# Patient Record
Sex: Female | Born: 1985 | Race: White | Hispanic: Yes | Smoking: Never smoker
Health system: Southern US, Community
[De-identification: ages and names within clinical notes are randomized; demographics above are authoritative.]

## PROBLEM LIST (undated history)

## (undated) DIAGNOSIS — N83201 Unspecified ovarian cyst, right side: Secondary | ICD-10-CM

## (undated) DIAGNOSIS — N83202 Unspecified ovarian cyst, left side: Secondary | ICD-10-CM

## (undated) HISTORY — PX: NO PAST SURGERIES: SHX2092

## (undated) HISTORY — DX: Unspecified ovarian cyst, right side: N83.201

## (undated) HISTORY — DX: Unspecified ovarian cyst, right side: N83.202

---

## 2018-09-24 ENCOUNTER — Encounter (HOSPITAL_COMMUNITY): Payer: Self-pay | Admitting: Emergency Medicine

## 2018-09-24 ENCOUNTER — Ambulatory Visit (HOSPITAL_COMMUNITY)
Admission: EM | Admit: 2018-09-24 | Discharge: 2018-09-24 | Disposition: A | Discharge status: Home / Self Care | Payer: BC Managed Care – PPO | Attending: Family Medicine | Admitting: Family Medicine

## 2018-09-24 ENCOUNTER — Other Ambulatory Visit: Payer: Self-pay

## 2018-09-24 DIAGNOSIS — Z3202 Encounter for pregnancy test, result negative: Secondary | ICD-10-CM | POA: Diagnosis not present

## 2018-09-24 DIAGNOSIS — N644 Mastodynia: Secondary | ICD-10-CM

## 2018-09-24 DIAGNOSIS — N6452 Nipple discharge: Secondary | ICD-10-CM

## 2018-09-24 LAB — POCT PREGNANCY, URINE: Preg Test, Ur: NEGATIVE

## 2018-09-24 MED ORDER — NAPROXEN 500 MG PO TABS: 500.0000 mg | ORAL_TABLET | Freq: Two times a day (BID) | ORAL | 0 refills | Status: DC

## 2018-09-24 NOTE — ED Triage Notes (Signed)
Pt presents to [facility] for assessment of bilateral breast pain x 4 years, with return of pain yesterday and patient noted discharge from her nipples.

## 2018-09-24 NOTE — Discharge Instructions (Addendum)
Se le llamar para programar una cita para la mamografa. Naproxeno dos veces al da para Best boy, tmelo con las comidas. Consulte con un proveedor de atencin primaria para que vuelva a verificar si hay sntomas persistentes.   You will be called to set up mammogram appointment time. Naproxen twice a day to help with pain, take with food.  please establish with a primary care provider for recheck for any persistent symptoms.

## 2018-09-24 NOTE — ED Provider Notes (Signed)
[place]    CSN: [id number] Arrival date & time: 09/24/18  1111      History   Chief Complaint Chief Complaint  Patient presents with  . Breast Pain    HPI Julie Mckay is a 33 y.o. female.   Julie Mckay presents with complaints of bilateral breast pain which started yesterday, as well as spontaneous discharge which was dark yellow in color. Today pain is 4/10 in severity. Pain is constant. LMP 8/12, still with some active bleeding. She states she tends to have some irregular periods which are heavy and last 7-10 days. She has a nexplanon. She has two living children. States she has had the same in the past and has had a mammogram which did find benign cysts. No other breast history. No family history of breast cancer. She moved here recently therefore does not have a PCP or gynecologist she follows with. Hasn't taken any medications for her symptoms.     ROS per HPI, negative if not otherwise mentioned.      History reviewed. No pertinent past medical history.  There are no active problems to display for this patient.   History reviewed. No pertinent surgical history.  OB History   No obstetric history on file.      Home Medications    Prior to Admission medications   Medication Sig Start Date End Date Taking? Authorizing Provider  naproxen (NAPROSYN) 500 MG tablet Take 1 tablet (500 mg total) by mouth 2 (two) times daily. 09/24/18   Zigmund Gottron, NP    Family History History reviewed. No pertinent family history.  Social History Social History   Tobacco Use  . Smoking status: Never Smoker  . Smokeless tobacco: Never Used  Substance Use Topics  . Alcohol use: Yes    Comment: sometimes  . Drug use: Never     Allergies   Patient has no known allergies.   Review of Systems Review of Systems   Physical Exam Triage Vital Signs ED Triage Vitals  Enc Vitals Group     BP 09/24/18 1219 117/79     Pulse Rate  09/24/18 1219 79     Resp 09/24/18 1219 16     Temp 09/24/18 1219 98.5 F (36.9 C)     Temp Source 09/24/18 1219 Oral     SpO2 09/24/18 1219 100 %     Weight --      Height --      Head Circumference --      Peak Flow --      Pain Score 09/24/18 1238 5     Pain Loc --      Pain Edu? --      Excl. in [place]? --    No data found.  Updated Vital Signs BP 117/79 (BP Location: Left Arm)   Pulse 79   Temp 98.5 F (36.9 C) (Oral)   Resp 16   SpO2 100%   Physical Exam Constitutional:      General: She is not in acute distress.    Appearance: She is well-developed.  Cardiovascular:     Rate and Rhythm: Normal rate.  Pulmonary:     Effort: Pulmonary effort is normal.  Chest:     Breasts:        Right: Tenderness present. No swelling, mass or skin change.        Left: Tenderness present. No swelling, mass or skin change.     Comments: Bilateral breasts are  generally tender without any visualized discharge and no obvious palpable mass or deformity Skin:    General: Skin is warm and dry.  Neurological:     Mental Status: She is alert and oriented to person, place, and time.      UC Treatments / Results  Labs (all labs ordered are listed, but only abnormal results are displayed) Labs Reviewed  POC URINE PREG, ED  POCT PREGNANCY, URINE    EKG   Radiology No results found.  Procedures Procedures (including critical care time)  Medications Ordered in UC Medications - No data to display  Initial Impression / Assessment and Plan / UC Course  I have reviewed the triage vital signs and the nursing notes.  Pertinent labs & imaging results that were available during my care of the patient were reviewed by me and considered in my medical decision making (see chart for details).     nsaids recommended. Mammogram ordered for monitoring due to spontaneous discharge. Encouraged follow up/ establish with PCP for recheck of persistent symptoms. Patient verbalized understanding  and agreeable to plan.  Ambulatory out of clinic without difficulty.    Final Clinical Impressions(s) / UC Diagnoses   Final diagnoses:  Breast pain  Breast discharge     Discharge Instructions     Se le llamar para programar una cita para la mamografa. Naproxeno dos veces al da para Best boy, tmelo con las comidas. Consulte con un proveedor de atencin primaria para que vuelva a verificar si hay sntomas persistentes.   You will be called to set up mammogram appointment time. Naproxen twice a day to help with pain, take with food.  please establish with a primary care provider for recheck for any persistent symptoms.    ED Prescriptions    Medication Sig Dispense Auth. Provider   naproxen (NAPROSYN) 500 MG tablet Take 1 tablet (500 mg total) by mouth 2 (two) times daily. 30 tablet Zigmund Gottron, NP     Controlled Substance Prescriptions [place] Controlled Substance Registry consulted? Not Applicable   Zigmund Gottron, NP 09/24/18 1425

## 2018-09-24 NOTE — ED Notes (Signed)
Patient able to ambulate independently  

## 2018-09-28 ENCOUNTER — Other Ambulatory Visit: Payer: Self-pay | Admitting: Emergency Medicine

## 2018-09-28 DIAGNOSIS — N644 Mastodynia: Secondary | ICD-10-CM

## 2018-10-08 ENCOUNTER — Ambulatory Visit
Admission: RE | Admit: 2018-10-08 | Discharge: 2018-10-08 | Disposition: A | Discharge status: Home / Self Care | Payer: BC Managed Care – PPO | Source: Ambulatory Visit | Attending: Emergency Medicine | Admitting: Emergency Medicine

## 2018-10-08 ENCOUNTER — Other Ambulatory Visit: Payer: Self-pay

## 2018-10-08 DIAGNOSIS — N644 Mastodynia: Secondary | ICD-10-CM

## 2018-10-08 DIAGNOSIS — R922 Inconclusive mammogram: Secondary | ICD-10-CM | POA: Diagnosis not present

## 2018-10-08 DIAGNOSIS — N6001 Solitary cyst of right breast: Secondary | ICD-10-CM | POA: Diagnosis not present

## 2018-10-08 DIAGNOSIS — N6002 Solitary cyst of left breast: Secondary | ICD-10-CM | POA: Diagnosis not present

## 2018-10-08 DIAGNOSIS — N6452 Nipple discharge: Secondary | ICD-10-CM | POA: Diagnosis not present

## 2018-10-18 DIAGNOSIS — N6452 Nipple discharge: Secondary | ICD-10-CM | POA: Diagnosis not present

## 2019-04-09 ENCOUNTER — Other Ambulatory Visit: Payer: Self-pay

## 2019-04-09 ENCOUNTER — Encounter: Payer: Self-pay | Admitting: Nurse Practitioner

## 2019-04-09 ENCOUNTER — Ambulatory Visit (INDEPENDENT_AMBULATORY_CARE_PROVIDER_SITE_OTHER): Payer: BC Managed Care – PPO | Admitting: Nurse Practitioner

## 2019-04-09 VITALS — BP 112/70 | HR 78 | Ht 59.0 in | Wt 156.7 lb

## 2019-04-09 DIAGNOSIS — Z113 Encounter for screening for infections with a predominantly sexual mode of transmission: Secondary | ICD-10-CM

## 2019-04-09 DIAGNOSIS — Z789 Other specified health status: Secondary | ICD-10-CM | POA: Insufficient documentation

## 2019-04-09 DIAGNOSIS — Z01419 Encounter for gynecological examination (general) (routine) without abnormal findings: Secondary | ICD-10-CM

## 2019-04-09 DIAGNOSIS — Z1151 Encounter for screening for human papillomavirus (HPV): Secondary | ICD-10-CM

## 2019-04-09 DIAGNOSIS — Z124 Encounter for screening for malignant neoplasm of cervix: Secondary | ICD-10-CM | POA: Diagnosis not present

## 2019-04-09 DIAGNOSIS — Z6831 Body mass index (BMI) 31.0-31.9, adult: Secondary | ICD-10-CM

## 2019-04-09 DIAGNOSIS — Z3046 Encounter for surveillance of implantable subdermal contraceptive: Secondary | ICD-10-CM | POA: Insufficient documentation

## 2019-04-09 DIAGNOSIS — Z8632 Personal history of gestational diabetes: Secondary | ICD-10-CM | POA: Insufficient documentation

## 2019-04-09 NOTE — Progress Notes (Signed)
GYNECOLOGY ANNUAL PREVENTATIVE CARE ENCOUNTER NOTE  Subjective:   Julie Mckay is a 34 y.o. [id number] female here for a routine annual gynecologic exam.  Current complaints: none.  Was seen in ER last month for orange drainage from both breasts but only occurred once and none since.  Reviewed note from ER.  No nipple discharge seen when in the ER.  Denies abnormal vaginal bleeding, discharge, pelvic pain, problems with intercourse or other gynecologic concerns.    Gynecologic History Patient's last menstrual period was 03/04/2019 (exact date). [place] 2 [place] 5 [place] Last Pap: unknown.    Obstetric History OB History  Gravida Para Term Preterm AB Living  2 1   1   2   SAB TAB Ectopic Multiple Live Births          2    # Outcome Date GA Lbr Len/2nd Weight Sex Delivery Anes PTL Lv  2 Gravida           1 Preterm             History reviewed. No pertinent past medical history.  Past Surgical History:  Procedure Laterality Date  . NO PAST SURGERIES      Current Outpatient Medications on File Prior to Visit  Medication Sig Dispense Refill  . naproxen (NAPROSYN) 500 MG tablet Take 1 tablet (500 mg total) by mouth 2 (two) times daily. 30 tablet 0   No current facility-administered medications on file prior to visit.    No Known Allergies  Social History   Socioeconomic History  . Marital status: Single    Spouse name: Not on file  . Number of children: Not on file  . Years of education: Not on file  . Highest education level: Not on file  Occupational History  . Not on file  Tobacco Use  . Smoking status: Never Smoker  . Smokeless tobacco: Never Used  Substance and Sexual Activity  . Alcohol use: Yes    Comment: sometimes  . Drug use: Never  . Sexual activity: Yes    Birth control/protection: Implant  Other Topics Concern  . Not on file  Social History Narrative  . Not on file   Social Determinants of  Health   Financial Resource Strain:   . Difficulty of Paying Living Expenses: Not on file  Food Insecurity:   . Worried About Charity fundraiser in the Last Year: Not on file  . Ran Out of Food in the Last Year: Not on file  Transportation Needs:   . Lack of Transportation (Medical): Not on file  . Lack of Transportation (Non-Medical): Not on file  Physical Activity:   . Days of Exercise per Week: Not on file  . Minutes of Exercise per Session: Not on file  Stress:   . Feeling of Stress : Not on file  Social Connections:   . Frequency of Communication with Friends and Family: Not on file  . Frequency of Social Gatherings with Friends and Family: Not on file  . Attends Religious Services: Not on file  . Active Member of Clubs or Organizations: Not on file  . Attends Archivist Meetings: Not on file  . Marital Status: Not on file  Intimate Partner Violence:   . Fear of Current or Ex-Partner: Not on file  . Emotionally Abused: Not on file  . Physically Abused: Not on file  . Sexually Abused: Not on file  History reviewed. No pertinent family history.  The following portions of the patient's history were reviewed and updated as appropriate: allergies, current medications, past family history, past medical history, past social history, past surgical history and problem list.  Review of Systems Pertinent items noted in HPI and remainder of comprehensive ROS otherwise negative.   Objective:  BP 112/70   Pulse 78   Ht 4\' 11"  (1.499 m)   Wt 156 lb 11.2 oz (71.1 kg)   LMP 03/04/2019 (Exact Date)   BMI 31.65 kg/m  CONSTITUTIONAL: Well-developed, well-nourished female in no acute distress.  HENT:  Normocephalic, atraumatic, External right and left ear normal.  EYES: Conjunctivae and EOM are normal. Pupils are equal, round.  No scleral icterus.  NECK: Normal range of motion, supple, no masses.  Normal thyroid.  SKIN: Skin is warm and dry. No rash noted. Not  diaphoretic. No erythema. No pallor. NEUROLOGIC: Alert and oriented to person, place, and time. Normal reflexes, muscle tone coordination. No cranial nerve deficit noted. PSYCHIATRIC: Normal mood and affect. Normal behavior. Normal judgment and thought content. CARDIOVASCULAR: Normal heart rate noted, regular rhythm RESPIRATORY: Clear to auscultation bilaterally. Effort and breath sounds normal, no problems with respiration noted. BREASTS: Symmetric in size. No masses, skin changes, nipple drainage, or lymphadenopathy. ABDOMEN: Soft, no distention noted.  No tenderness, rebound or guarding.  PELVIC: Normal appearing external genitalia; normal appearing vaginal mucosa and cervix.  No abnormal discharge noted.  Pap smear obtained.  Normal uterine size, no other palpable masses, no uterine or adnexal tenderness. MUSCULOSKELETAL: Normal range of motion. No tenderness.  No cyanosis, clubbing, or edema.   Implant - 2 rods in left upper arm - V shape palpated.  Assessment and Plan:  1. BMI 31.0-31.9,adult Discussed with client and recommended BMI of less than 25.  2. Encounter for well woman exam with routine gynecological exam Normal breast exam today  - no tenderness, no nipple discharge.  Advised to contact the office if has any further nipple discharge, May want BTL - advised it is permanent contraception and if she desires surgery to call to see if her insurance covers the surgery and then make an appointment with MD in the office to schedule surgery.  - Cytology - PAP( [place]) - Cervicovaginal ancillary only( [place])  3. Language barrier In person Spanish interpreter present for the entire visit.  4. Implantable subdermal contraceptive surveillance Placed 2 years ago in [place], [place].  Duration is 5 years with this type of implant according to client.  5. History of gestational diabetes Advised weight loss and maintain at BMI of less than 25  Will follow up results  of pap smear and manage accordingly. Mammogram scheduled Routine preventative health maintenance measures emphasized. Please refer to After Visit Summary for other counseling recommendations.    Earlie Server, RN, MSN, NP-BC Nurse Practitioner, [place] for [facility]

## 2019-04-10 LAB — CERVICOVAGINAL ANCILLARY ONLY
Comment: NEGATIVE
Trichomonas: NEGATIVE

## 2019-04-10 LAB — CYTOLOGY - PAP
Chlamydia: NEGATIVE
Comment: NEGATIVE
Comment: NEGATIVE
Comment: NORMAL
Diagnosis: NEGATIVE
High risk HPV: NEGATIVE
Neisseria Gonorrhea: NEGATIVE

## 2019-04-20 ENCOUNTER — Ambulatory Visit: Payer: BC Managed Care – PPO | Attending: Internal Medicine

## 2019-04-20 DIAGNOSIS — Z23 Encounter for immunization: Secondary | ICD-10-CM

## 2019-04-20 NOTE — Progress Notes (Signed)
   Covid-19 Vaccination Clinic  Name:  Julie Mckay    MRN: [id number] DOB: [birth year]  04/20/2019  Ms. Julie Mckay was observed post Covid-19 immunization for 15 minutes without incident. She was provided with Vaccine Information Sheet and instruction to access the V-Safe system.   Ms. Julie Mckay was instructed to call 911 with any severe reactions post vaccine: Marland Kitchen Difficulty breathing  . Swelling of face and throat  . A fast heartbeat  . A bad rash all over body  . Dizziness and weakness   Immunizations Administered    Name Date Dose VIS Date Route   Pfizer COVID-19 Vaccine 04/20/2019  9:56 AM 0.3 mL 01/18/2019 Intramuscular   Manufacturer: [place]   Lot: [id number]   [place]: [id number]

## 2019-05-11 ENCOUNTER — Emergency Department (HOSPITAL_COMMUNITY)
Admission: EM | Admit: 2019-05-11 | Discharge: 2019-05-11 | Disposition: A | Discharge status: Home / Self Care | Payer: BC Managed Care – PPO | Attending: Emergency Medicine | Admitting: Emergency Medicine

## 2019-05-11 ENCOUNTER — Emergency Department (HOSPITAL_COMMUNITY): Payer: BC Managed Care – PPO

## 2019-05-11 ENCOUNTER — Other Ambulatory Visit: Payer: Self-pay

## 2019-05-11 DIAGNOSIS — Z20822 Contact with and (suspected) exposure to covid-19: Secondary | ICD-10-CM | POA: Diagnosis not present

## 2019-05-11 DIAGNOSIS — R112 Nausea with vomiting, unspecified: Secondary | ICD-10-CM | POA: Diagnosis not present

## 2019-05-11 DIAGNOSIS — R1110 Vomiting, unspecified: Secondary | ICD-10-CM | POA: Diagnosis not present

## 2019-05-11 DIAGNOSIS — R197 Diarrhea, unspecified: Secondary | ICD-10-CM | POA: Diagnosis not present

## 2019-05-11 DIAGNOSIS — R1032 Left lower quadrant pain: Secondary | ICD-10-CM | POA: Diagnosis not present

## 2019-05-11 LAB — WET PREP, GENITAL
Clue Cells Wet Prep HPF POC: NONE SEEN
Sperm: NONE SEEN
Trich, Wet Prep: NONE SEEN

## 2019-05-11 LAB — COMPREHENSIVE METABOLIC PANEL
ALT: 32 U/L (ref 0–44)
AST: 27 U/L (ref 15–41)
Albumin: 4.1 g/dL (ref 3.5–5.0)
Alkaline Phosphatase: 148 U/L — ABNORMAL HIGH (ref 38–126)
Anion gap: 12 (ref 5–15)
BUN: 15 mg/dL (ref 6–20)
CO2: 22 mmol/L (ref 22–32)
Calcium: 9.1 mg/dL (ref 8.9–10.3)
Chloride: 105 mmol/L (ref 98–111)
Creatinine, Ser: 0.76 mg/dL (ref 0.44–1.00)
GFR calc Af Amer: >60 mL/min (ref >60)
GFR calc non Af Amer: >60 mL/min (ref >60)
Glucose, Bld: 110 mg/dL — ABNORMAL HIGH (ref 70–99)
Potassium: 3.8 mmol/L (ref 3.5–5.1)
Sodium: 139 mmol/L (ref 135–145)
Total Bilirubin: 0.8 mg/dL (ref 0.3–1.2)
Total Protein: 7.6 g/dL (ref 6.5–8.1)

## 2019-05-11 LAB — CBC
HCT: 45.1 % (ref 36.0–46.0)
Hemoglobin: 15 g/dL (ref 12.0–15.0)
MCH: 31.1 pg (ref 26.0–34.0)
MCHC: 33.3 g/dL (ref 30.0–36.0)
MCV: 93.4 fL (ref 80.0–100.0)
Platelets: 302 10*3/uL (ref 150–400)
RBC: 4.83 MIL/uL (ref 3.87–5.11)
RDW: 13.2 % (ref 11.5–15.5)
WBC: 15.2 10*3/uL — ABNORMAL HIGH (ref 4.0–10.5)
nRBC: 0 % (ref 0.0–0.2)

## 2019-05-11 LAB — URINALYSIS, ROUTINE W REFLEX MICROSCOPIC
Bilirubin Urine: NEGATIVE
Glucose, UA: NEGATIVE mg/dL
Hgb urine dipstick: NEGATIVE
Ketones, ur: NEGATIVE mg/dL
Leukocytes,Ua: NEGATIVE
Nitrite: NEGATIVE
Protein, ur: NEGATIVE mg/dL
Specific Gravity, Urine: 1.025 (ref 1.005–1.030)
pH: 7 (ref 5.0–8.0)

## 2019-05-11 LAB — I-STAT BETA HCG BLOOD, ED (MC, WL, AP ONLY): I-stat hCG, quantitative: <5 m[IU]/mL (ref <5)

## 2019-05-11 LAB — SARS CORONAVIRUS 2 (TAT 6-24 HRS): SARS Coronavirus 2: NEGATIVE

## 2019-05-11 LAB — LIPASE, BLOOD: Lipase: 26 U/L (ref 11–51)

## 2019-05-11 MED ORDER — ONDANSETRON HCL 4 MG/2ML IJ SOLN
4.0000 mg | Freq: Once | INTRAMUSCULAR | Status: AC
  Administered 2019-05-11: 14:00:00 4 mg via INTRAVENOUS
  Filled 2019-05-11: qty 2

## 2019-05-11 MED ORDER — ONDANSETRON 4 MG PO TBDP: 4.0000 mg | ORAL_TABLET | Freq: Four times a day (QID) | ORAL | 0 refills | Status: DC | PRN

## 2019-05-11 MED ORDER — MORPHINE SULFATE (PF) 4 MG/ML IV SOLN
4.0000 mg | Freq: Once | INTRAVENOUS | Status: AC
  Administered 2019-05-11: 14:00:00 4 mg via INTRAVENOUS
  Filled 2019-05-11: qty 1

## 2019-05-11 MED ORDER — IOHEXOL 300 MG/ML  SOLN
100.0000 mL | Freq: Once | INTRAMUSCULAR | Status: AC | PRN
  Administered 2019-05-11: 14:00:00 100 mL via INTRAVENOUS

## 2019-05-11 MED ORDER — SODIUM CHLORIDE 0.9 % IV BOLUS
1000.0000 mL | Freq: Once | INTRAVENOUS | Status: AC
  Administered 2019-05-11: 14:00:00 1000 mL via INTRAVENOUS

## 2019-05-11 MED ORDER — ALUM & MAG HYDROXIDE-SIMETH 200-200-20 MG/5ML PO SUSP
30.0000 mL | Freq: Once | ORAL | Status: AC
  Administered 2019-05-11: 16:00:00 30 mL via ORAL
  Filled 2019-05-11: qty 30

## 2019-05-11 MED ORDER — SODIUM CHLORIDE 0.9% FLUSH: 3.0000 mL | Freq: Once | INTRAVENOUS | Status: DC

## 2019-05-11 MED ORDER — LIDOCAINE VISCOUS HCL 2 % MT SOLN
15.0000 mL | Freq: Once | OROMUCOSAL | Status: AC
  Administered 2019-05-11: 16:00:00 15 mL via ORAL
  Filled 2019-05-11: qty 15

## 2019-05-11 NOTE — ED Notes (Signed)
Patient verbalizes understanding of discharge instructions. Opportunity for questioning and answers were provided. Armband removed by staff, pt discharged from ED ambulatory to home.  

## 2019-05-11 NOTE — Discharge Instructions (Addendum)
Your results from the pelvic exam that have returned are reassuring.   Zofran is for nausea and vomiting.   Drink plenty of water.

## 2019-05-11 NOTE — ED Provider Notes (Signed)
[facility] EMERGENCY DEPARTMENT Provider Note   CSN: [id number] Arrival date & time: 05/11/19  1157     History Chief Complaint  Patient presents with  . Abdominal Pain    Julie Mckay is a 34 y.o. female.  The history is provided by the patient. No language interpreter was used.  Abdominal Pain    34 year old Hispanic speaking female presenting for evaluation of abdominal pain.  History obtained through language interpreter.  Patient report she developed diffuse abdominal pain that started last night and has been persistent throughout the day today.  Her pain is described as a sharp cramping sensation more significant to her left lower quadrant with associate nausea vomiting diarrhea.  States she has had multiple episodes of nonbloody nonbilious vomiting as well as having loose stools.  Pain is waxing and waning.  She endorsed chills.  She denies having fever chest pain shortness of breath productive cough dysuria hematuria vaginal bleeding vaginal discharge.  She denies any recent sick contact, not eating anything abnormal, denies alcohol use.  Last menstrual period was March 12.  No specific treatment tried prior to arrival.  No past medical history on file.  Patient Active Problem List   Diagnosis Date Noted  . BMI 31.0-31.9,adult 04/09/2019  . Implantable subdermal contraceptive surveillance 04/09/2019  . Language barrier 04/09/2019  . History of gestational diabetes 04/09/2019    Past Surgical History:  Procedure Laterality Date  . NO PAST SURGERIES       OB History    Gravida  2   Para  1   Term      Preterm  1   AB      Living  2     SAB      TAB      Ectopic      Multiple      Live Births  2           No family history on file.  Social History   Tobacco Use  . Smoking status: Never Smoker  . Smokeless tobacco: Never Used  Substance Use Topics  . Alcohol use: Yes    Comment: sometimes  . Drug use: Never      Home Medications Prior to Admission medications   Medication Sig Start Date End Date Taking? Authorizing Provider  naproxen (NAPROSYN) 500 MG tablet Take 1 tablet (500 mg total) by mouth 2 (two) times daily. 09/24/18   Zigmund Gottron, NP    Allergies    Patient has no known allergies.  Review of Systems   Review of Systems  Gastrointestinal: Positive for abdominal pain.  All other systems reviewed and are negative.   Physical Exam Updated Vital Signs BP 112/64 (BP Location: Right Arm)   Pulse (!) 110   Temp 98 F (36.7 C) (Oral)   Resp (!) 24   SpO2 95%   Physical Exam Vitals and nursing note reviewed.  Constitutional:      Appearance: She is well-developed. She is obese.     Comments: Patient appears uncomfortable, actively vomiting.  HENT:     Head: Atraumatic.  Eyes:     Conjunctiva/sclera: Conjunctivae normal.  Cardiovascular:     Rate and Rhythm: Tachycardia present.  Pulmonary:     Effort: Pulmonary effort is normal.     Breath sounds: Normal breath sounds. No wheezing, rhonchi or rales.  Abdominal:     General: Abdomen is flat. Bowel sounds are normal.     Palpations:  Abdomen is soft.     Tenderness: There is generalized abdominal tenderness (Tenderness throughout abdomen more significant to left lower quadrant.  No right lower quadrant tenderness.  Guarding but no rebound tenderness.).  Musculoskeletal:     Cervical back: Neck supple.  Skin:    Findings: No rash.  Neurological:     Mental Status: She is alert.     ED Results / Procedures / Treatments   Labs (all labs ordered are listed, but only abnormal results are displayed) Labs Reviewed  COMPREHENSIVE METABOLIC PANEL - Abnormal; Notable for the following components:      Result Value   Glucose, Bld 110 (*)    Alkaline Phosphatase 148 (*)    All other components within normal limits  CBC - Abnormal; Notable for the following components:   WBC 15.2 (*)    All other components within  normal limits  URINALYSIS, ROUTINE W REFLEX MICROSCOPIC - Abnormal; Notable for the following components:   APPearance HAZY (*)    All other components within normal limits  WET PREP, GENITAL  LIPASE, BLOOD  I-STAT BETA HCG BLOOD, ED ([facility], [facility], [facility] ONLY)  GC/CHLAMYDIA PROBE AMP ([place]) NOT AT [facility]    EKG None  Radiology CT ABDOMEN PELVIS W CONTRAST  Result Date: 05/11/2019 CLINICAL DATA:  Vomiting and diarrhea beginning earlier today. Left-sided abdominal pain. EXAM: CT ABDOMEN AND PELVIS WITH CONTRAST TECHNIQUE: Multidetector CT imaging of the abdomen and pelvis was performed using the standard protocol following bolus administration of intravenous contrast. CONTRAST:  1102mL OMNIPAQUE IOHEXOL 300 MG/ML  SOLN COMPARISON:  None. FINDINGS: Lower chest: Clear lung bases.  Heart normal in size. Hepatobiliary: No focal liver abnormality is seen. No gallstones, gallbladder wall thickening, or biliary dilatation. Pancreas: Unremarkable. No pancreatic ductal dilatation or surrounding inflammatory changes. Spleen: Normal in size without focal abnormality. Adrenals/Urinary Tract: Adrenal glands are unremarkable. Kidneys are normal, without renal calculi, focal lesion, or hydronephrosis. Bladder is unremarkable. Stomach/Bowel: Stomach is within normal limits. Appendix appears normal. No evidence of bowel wall thickening, distention, or inflammatory changes. Vascular/Lymphatic: No significant vascular findings are present. No enlarged abdominal or pelvic lymph nodes. Reproductive: Uterus and bilateral adnexa are unremarkable. Other: No abdominal wall hernia or abnormality. No abdominopelvic ascites. Musculoskeletal: No acute or significant osseous findings. IMPRESSION: Normal enhanced CT scan of the abdomen and pelvis. Electronically Signed   By: Lajean Manes M.D.   On: 05/11/2019 14:42    Procedures Procedures (including critical care time)  Medications Ordered in ED Medications  sodium chloride  flush (NS) 0.9 % injection 3 mL (has no administration in time range)  alum & mag hydroxide-simeth (MAALOX/MYLANTA) 200-200-20 MG/5ML suspension 30 mL (has no administration in time range)    And  lidocaine (XYLOCAINE) 2 % viscous mouth solution 15 mL (has no administration in time range)  sodium chloride 0.9 % bolus 1,000 mL (1,000 mLs Intravenous New Bag/Given 05/11/19 1337)  morphine 4 MG/ML injection 4 mg (4 mg Intravenous Given 05/11/19 1338)  ondansetron (ZOFRAN) injection 4 mg (4 mg Intravenous Given 05/11/19 1338)  iohexol (OMNIPAQUE) 300 MG/ML solution 100 mL (100 mLs Intravenous Contrast Given 05/11/19 1423)    ED Course  I have reviewed the triage vital signs and the nursing notes.  Pertinent labs & imaging results that were available during my care of the patient were reviewed by me and considered in my medical decision making (see chart for details).    MDM Rules/Calculators/A&P  BP 112/64 (BP Location: Right Arm)   Pulse (!) 110   Temp 98 F (36.7 C) (Oral)   Resp (!) 24   SpO2 95%   Final Clinical Impression(s) / ED Diagnoses Final diagnoses:  Nausea vomiting and diarrhea    Rx / DC Orders ED Discharge Orders    None     12:58 PM Patient here with abdominal pain and associate nausea vomiting diarrhea.  Pain so significant to left lower quadrant but diffuse on exam.  She appears uncomfortable.  Will obtain abdominal pelvis CT scan for further evaluation.  Pain medication, antinausea medication, and IV fluid provided.  3:40 PM Elevated white count of 15.2.  An abdominal pelvis CT scan was obtained showed no acute finding.  I suspect patient abdominal discomfort is likely due to GI cause.  Will provide symptomatic treatment.  Pt sign out to oncoming provider who will reassess pt, perform PO trial and determine disposition.    Domenic Moras, PA-C 05/11/19 1541    Charlesetta Shanks, MD 05/12/19 [phone]

## 2019-05-11 NOTE — ED Provider Notes (Signed)
I assumed care of patient at shift change from previous team, please see their note for full H&P. Briefly patient is here for evaluation of abdominal pain, nausea, vomiting, and diarrhea. Physical Exam  BP 112/64 (BP Location: Right Arm)   Pulse (!) 110   Temp 98 F (36.7 C) (Oral)   Resp (!) 24   SpO2 95%   Physical Exam Exam conducted with a chaperone present Benjamine Mola, Therapist, sports).  Constitutional:      General: She is not in acute distress. Abdominal:     Tenderness: There is generalized abdominal tenderness and tenderness in the left lower quadrant. There is no guarding or rebound.  Genitourinary:    Vagina: Normal. No foreign body. No vaginal discharge.     Cervix: No cervical motion tenderness, discharge or friability.     Uterus: Normal. Not tender.      Adnexa: Right adnexa normal and left adnexa normal.  Skin:    General: Skin is warm and dry.  Neurological:     Mental Status: She is alert.     ED Course/Procedures     Procedures  MDM  Plan is to follow-up on symptoms after medications. Patient reports feeling better after medications.  On repeat exam she has significant tenderness to palpation throughout the entire abdomen however it is worse in the left lower quadrant.  CT scan did not show significant mass in this area.  I discussed role of pelvic exam with the patient who wished for pelvic exam to be performed. Wet prep is unremarkable. Suspect primary gastroenteritis.  She is given a prescription for Zofran for symptom control and we discussed the importance of p.o. hydration.  Patient PO challenged with out difficulty.    Return precautions were discussed with patient who states their understanding.  At the time of discharge patient denied any unaddressed complaints or concerns.  Patient is agreeable for discharge home.  Note: Portions of this report may have been transcribed using voice recognition software. Every effort was made to ensure accuracy; however,  inadvertent computerized transcription errors may be present        Ollen Gross 05/12/19 0101    Virgel Manifold, MD 05/12/19 2117

## 2019-05-11 NOTE — ED Triage Notes (Signed)
Onset 12am today vomiting x >10, diarrhea >10 watery, and abd pain on left side and sometimes all over abd.

## 2019-05-13 LAB — GC/CHLAMYDIA PROBE AMP (~~LOC~~) NOT AT ARMC
Chlamydia: NEGATIVE
Comment: NEGATIVE
Comment: NORMAL
Neisseria Gonorrhea: NEGATIVE

## 2019-05-14 ENCOUNTER — Ambulatory Visit: Payer: BC Managed Care – PPO | Attending: Internal Medicine

## 2019-05-14 DIAGNOSIS — Z23 Encounter for immunization: Secondary | ICD-10-CM

## 2019-05-14 NOTE — Progress Notes (Signed)
   Covid-19 Vaccination Clinic  Name:  Julie Mckay    MRN: [id number] DOB: 1985-08-02  05/14/2019  Ms. Ceceila Mowad was observed post Covid-19 immunization for 15 minutes without incident. She was provided with Vaccine Information Sheet and instruction to access the V-Safe system.   Ms. Cheray Schnettler was instructed to call 911 with any severe reactions post vaccine: Marland Kitchen Difficulty breathing  . Swelling of face and throat  . A fast heartbeat  . A bad rash all over body  . Dizziness and weakness   Immunizations Administered    Name Date Dose VIS Date Route   Pfizer COVID-19 Vaccine 05/14/2019  4:08 PM 0.3 mL 01/18/2019 Intramuscular   Manufacturer: [employer]   Lot: [id number]   [place]: [id number]

## 2019-06-18 DIAGNOSIS — Z03818 Encounter for observation for suspected exposure to other biological agents ruled out: Secondary | ICD-10-CM | POA: Diagnosis not present

## 2019-06-18 DIAGNOSIS — Z20828 Contact with and (suspected) exposure to other viral communicable diseases: Secondary | ICD-10-CM | POA: Diagnosis not present

## 2020-05-06 ENCOUNTER — Other Ambulatory Visit: Payer: Self-pay

## 2020-05-06 ENCOUNTER — Encounter: Payer: Self-pay | Admitting: Women's Health

## 2020-05-06 ENCOUNTER — Ambulatory Visit (INDEPENDENT_AMBULATORY_CARE_PROVIDER_SITE_OTHER): Payer: BC Managed Care – PPO | Admitting: Women's Health

## 2020-05-06 ENCOUNTER — Other Ambulatory Visit (HOSPITAL_COMMUNITY)
Admission: RE | Admit: 2020-05-06 | Discharge: 2020-05-06 | Disposition: A | Discharge status: Home / Self Care | Payer: BC Managed Care – PPO | Source: Ambulatory Visit | Attending: Obstetrics & Gynecology | Admitting: Obstetrics & Gynecology

## 2020-05-06 VITALS — BP 116/70 | HR 82 | Ht 62.0 in | Wt 161.6 lb

## 2020-05-06 DIAGNOSIS — R102 Pelvic and perineal pain unspecified side: Secondary | ICD-10-CM

## 2020-05-06 DIAGNOSIS — N926 Irregular menstruation, unspecified: Secondary | ICD-10-CM

## 2020-05-06 DIAGNOSIS — R6882 Decreased libido: Secondary | ICD-10-CM

## 2020-05-06 DIAGNOSIS — Z113 Encounter for screening for infections with a predominantly sexual mode of transmission: Secondary | ICD-10-CM | POA: Diagnosis not present

## 2020-05-06 NOTE — Progress Notes (Signed)
   GYN VISIT Patient name: Julie Mckay MRN [id number]  Date of birth: 10-09-1985 Chief Complaint:   New Patient (Initial Visit) and Vaginal Pain  History of Present Illness:   Julie Mckay is a 35 y.o. G64P2 Hispanic female being seen today for report of irregular periods, pelvic pain, decreased libido for years.  Has had Nexplanon in since March 2019, feels like sx started around that time.  Denies depression, does report some anxiety. Denies abnormal discharge, itching/odor/irritation.  Wants BTL.    Depression screen PHQ 2/9 05/06/2020  Decreased Interest 1  Down, Depressed, Hopeless 1  PHQ - 2 Score 2  Altered sleeping 2  Tired, decreased energy 3  Change in appetite 1  Feeling bad or failure about yourself  2  Trouble concentrating 1  Moving slowly or fidgety/restless 2  Suicidal thoughts 0  PHQ-9 Score 13    Patient's last menstrual period was 04/27/2020 (exact date). The current method of family planning is Nexplanon.  Last pap 04/09/19. Results were: NILM w/ HRHPV negative Review of Systems:   Pertinent items are noted in HPI Denies fever/chills, dizziness, headaches, visual disturbances, fatigue, shortness of breath, chest pain, abdominal pain, vomiting, abnormal vaginal discharge/itching/odor/irritation, problems with periods, bowel movements, urination, or intercourse unless otherwise stated above.  Pertinent History Reviewed:  Reviewed past medical,surgical, social, obstetrical and family history.  Reviewed problem list, medications and allergies. Physical Assessment:   Vitals:   05/06/20 1350  BP: 116/70  Pulse: 82  Weight: 161 lb 9.6 oz (73.3 kg)  Height: 5\' 2"  (1.575 m)  Body mass index is 29.56 kg/m.       Physical Examination: by Jeanann Lewandowsky, SNP   General appearance: alert, well appearing, and in no distress  Mental status: alert, oriented to person, place, and time  Skin: warm & dry   Cardiovascular: normal heart rate  noted  Respiratory: normal respiratory effort, no distress  Abdomen: soft, non-tender   Pelvic: VULVA: normal appearing vulva with no masses, tenderness or lesions, VAGINA: normal appearing vagina with normal color and discharge, no lesions, CERVIX: normal appearing cervix without discharge or lesions, UTERUS: uterus is normal size, shape, consistency and nontender, ADNEXA: normal adnexa in size, nontender and no masses  Extremities: no edema   Chaperone: me    No results found for this or any previous visit (from the past 24 hour(s)).  Assessment & Plan:  1) Irregular periods, pelvic pain, decreased libido> all since Nexplanon inserted 60yrs ago. Time for removal. Wants BTL. CV swab sent. Schedule w/ MD to discuss BTL and removal of Nexplanon at same time.   Meds: No orders of the defined types were placed in this encounter.   No orders of the defined types were placed in this encounter.   Return for 1st available, MD only to discuss BTL.  [place], [facility] 05/06/2020 2:58 PM

## 2020-05-08 LAB — CERVICOVAGINAL ANCILLARY ONLY
Bacterial Vaginitis (gardnerella): NEGATIVE
Candida Glabrata: NEGATIVE
Candida Vaginitis: NEGATIVE
Chlamydia: NEGATIVE
Comment: NEGATIVE
Comment: NEGATIVE
Comment: NEGATIVE
Comment: NEGATIVE
Comment: NEGATIVE
Comment: NORMAL
Neisseria Gonorrhea: NEGATIVE
Trichomonas: NEGATIVE

## 2020-05-26 ENCOUNTER — Ambulatory Visit (INDEPENDENT_AMBULATORY_CARE_PROVIDER_SITE_OTHER): Payer: BC Managed Care – PPO | Admitting: Obstetrics & Gynecology

## 2020-05-26 ENCOUNTER — Other Ambulatory Visit: Payer: Self-pay

## 2020-05-26 ENCOUNTER — Encounter: Payer: Self-pay | Admitting: Obstetrics & Gynecology

## 2020-05-26 VITALS — BP 104/72 | HR 80 | Ht 63.0 in | Wt 162.0 lb

## 2020-05-26 DIAGNOSIS — Z3009 Encounter for other general counseling and advice on contraception: Secondary | ICD-10-CM

## 2020-05-26 NOTE — Progress Notes (Signed)
Follow up appointment for: Tubal consult  Chief Complaint  Patient presents with  . Nexplanon removal    Discuss getting a tubal    Blood pressure 104/72, pulse 80, height 5\' 3"  (1.6 m), weight 162 lb (73.5 kg), last menstrual period 05/23/2020.    Julie Mckay [id number] Patient's last menstrual period was 05/23/2020.  Who currently has some variation of Nexplanon in her left arm She had it placed in [place] it is 2 devices and she states it is good for 5 years Which means it is probably some variant of Nexplanon available in [place] but not here in the states She had it placed in 2019  She and her husband had discussed extensively that they do not want any more kids We talked about pills IUD continued Nexplanon other reversible forms of birth control  We discussed the issues of a tubal ligation including a partial salpingectomy electrocautery and bilateral salpingectomy She understands that a bilateral salpingectomy carries the lowest failure rate and also imparts protection against ovarian cancer lowering the lifetime risk by 25 to 40%  After several questions were answered regarding particulars of the surgery they have decided to proceed with a bilateral salpingectomy  We spent approximately 20 minutes talking about to have removal of the Nexplanon at the time of surgery is in her left arm as well as the anesthesia at the preop labs and evaluation in the postoperative recovery  All questions were answered Total time required during this encounter was 20 minutes   MEDS ordered this encounter: No orders of the defined types were placed in this encounter.   Orders for this encounter: No orders of the defined types were placed in this encounter.   Impression: 1. Sterilization consult/ birth control consult    Plan: Laparoscopic bilateral salpingectomy and removal of nexplanon systems   Follow Up: Return in about 7 weeks (around 07/17/2020) for [employer] visit, [place], with Dr Elonda Husky.      All questions were answered.  Past Medical History:  Diagnosis Date  . Bilateral ovarian cysts     Past Surgical History:  Procedure Laterality Date  . NO PAST SURGERIES      OB History    Gravida  2   Para  2   Term  1   Preterm  1   AB      Living  2     SAB      IAB      Ectopic      Multiple      Live Births  2           No Known Allergies  Social History   Socioeconomic History  . Marital status: Soil scientist    Spouse name: Not on file  . Number of children: Not on file  . Years of education: Not on file  . Highest education level: Not on file  Occupational History  . Not on file  Tobacco Use  . Smoking status: Never Smoker  . Smokeless tobacco: Never Used  Vaping Use  . Vaping Use: Some days  Substance and Sexual Activity  . Alcohol use: Yes    Comment: sometimes  . Drug use: Never  . Sexual activity: Yes    Birth control/protection: Implant  Other Topics Concern  . Not on file  Social History Narrative  . Not on file   Social Determinants of Health   Financial Resource Strain: Not on file  Food Insecurity: No Food Insecurity  . Worried About Charity fundraiser in the Last Year: Never true  . Ran Out of Food in the Last Year: Never true  Transportation Needs: No Transportation Needs  . Lack of Transportation (Medical): No  . Lack of Transportation (Non-Medical): No  Physical Activity: Inactive  . Days of Exercise per Week: 0 days  . Minutes of Exercise per Session: 0 min  Stress: No Stress Concern Present  . Feeling of Stress : Not at all  Social Connections: Moderately Isolated  . Frequency of Communication with Friends and Family: More than three times a week  . Frequency of Social Gatherings with Friends and Family: Once a week  . Attends Religious Services: Never  . Active Member of Clubs or Organizations: No  . Attends Archivist Meetings: Never  .  Marital Status: Living with partner    Family History  Problem Relation Age of Onset  . Diabetes Maternal Grandmother   . Hypertension Father

## 2020-07-01 NOTE — Patient Instructions (Signed)
Instrucciones Para Antes de la Ciruga   Su ciruga est programada para-(your procedure is scheduled on)  07/08/2020 at 0930 am.   Marlinda Mike - (enter)    Por favor llame al [phone] si tiene algn problema en la maana de la ciruga. (please call if you have any problems the morning of surgery.)                  Recuerde: (Remember)   No coma alimentos ni tome lquidos, incluyendo agua, despus de la medianoche del  (Do not eat food after midnight on 07/07/2020. You may drink liquids until 0730 am. At 0730 am drink your carb drink. After this you may have nothing else to drink.    Tome estas medicinas en la maana de la ciruga con un SORBITO de agua (take these meds the morning of surgery with a SIP of water)   NONE   Puede cepillarse los dientes en la maana de la [place]. (you may brush your teeth the morning of surgery)   No use joyas, maquillaje de ojos, lpiz labial, crema para el cuerpo o esmalte de uas oscuro. (Do not wear jewelry, eye makeup, lipstick, body lotion, or dark fingernail polish)   No puede usar desodorante. (you may wear deodorant)   Si va a ser ingresado despues de la ciruga, deje la maleta en el carro hasta que se le haya asignado una habitacin. (If you are to be admitted after surgery, leave suitcase in car until your room has been assigned.)   A los pacientes que se les d de alta el mismo da no se les permitir manejar a casa.  (Patients discharged on the day of surgery will not be allowed to drive home) and must have someone with them for 24 hours.   Use ropa suelta y cmoda de regreso a casa. (wear loose comfortable clothes for ride home)     Salpingectoma, cuidados posteriores Salpingectomy, Care After Esta hoja le brinda informacin sobre cmo cuidarse despus del procedimiento. Su mdico tambin podr darle instrucciones ms especficas. Comunquese con el mdico si tiene problemas o  preguntas. Qu puedo esperar despus del procedimiento? Despus del procedimiento, es comn tener los siguientes sntomas:  Dolor en el abdomen.  Algo de sangrado vaginal leve (prdidas) durante unos [employer].  Cansancio. El tiempo de recuperacin variar segn el mtodo que el cirujano haya utilizado para la [place]. Siga estas instrucciones en su casa: Cuidado de la incisin  Siga las instrucciones del mdico acerca del [place] incisiones. Asegrese de hacer lo siguiente: ? Lvese las manos con agua y [place] antes y despus de cambiar la venda (vendaje). Use desinfectante para manos si no dispone de [place] y [place]. ? Cambie o retire Forensic scientist se lo haya indicado el mdico. ? No retire los puntos (suturas), la goma para cerrar la piel o las tiras [place]. Es posible que estos cierres cutneos deban quedar puestos en la piel durante 2semanas o ms tiempo. Si los bordes de las tiras adhesivas empiezan a despegarse y Therapist, sports, puede recortar los que estn sueltos. No retire las tiras [facility] por completo a menos que el mdico se lo indique.  Mantenga el vendaje limpio y  seco.  Controle todos los das la zona de la incisin para detectar signos de infeccin. Est atento a los siguientes signos: ? Enrojecimiento, hinchazn o dolor que empeoran. ? Lquido o sangre. ? Calor. ? Pus o mal olor.   Actividad  Haga reposo como se lo haya indicado el mdico.  Evite estar sentado durante largos perodos sin moverse. Levntese y camine un poco cada 1 a 2 horas. Esto es importante para mejorar el flujo sanguneo y la respiracin. Pida ayuda si se siente dbil o inestable.  Retome sus actividades normales segn lo indicado por el mdico. Pregntele al mdico qu actividades son seguras para usted.  No conduzca hasta que el mdico diga que es [place].  No levante ningn objeto que pese ms de 10libras (4.5kg) o que supere el lmite de peso que le hayan indicado, hasta que el  mdico le diga que puede [place]. Esto puede ser de 2 a 6 semanas, segn la [place].  Hasta que el mdico la autorice: ? No se haga duchas vaginales. ? No use tampones. ? No tenga relaciones sexuales. Medicamentos  [employer] de venta libre y los recetados solamente como se lo haya indicado el mdico.  Pregntele al mdico si el medicamento recetado: ? Hace que sea necesario que evite conducir o usar maquinaria pesada. ? Puede causarle estreimiento. Es posible que deba tomar medidas para prevenir o tratar el estreimiento, por ejemplo:  Beber suficiente lquido como para Theatre manager la orina de color amarillo plido.  Tomar medicamentos recetados o de [employer].  Consumir alimentos ricos en fibra, como frijoles, cereales integrales, y frutas y verduras frescas.  Limitar el consumo de alimentos ricos en grasa y azcares procesados, como los alimentos fritos o dulces. Instrucciones generales  Use medias de compresin como se lo haya indicado su mdico. Estas medias ayudan a evitar la formacin de cogulos de sangre y a reducir la hinchazn de las piernas.  No consuma ningn producto que contenga nicotina o tabaco, como cigarrillos, cigarrillos electrnicos y tabaco de Higher education careers adviser. Si necesita ayuda para dejar de fumar, consulte al mdico.  No tome baos de inmersin, no nade ni use el jacuzzi hasta que el mdico lo autorice. Puede ducharse.  Concurra a todas las visitas de seguimiento como se lo haya indicado el mdico. Esto es importante. Comunquese con un mdico si tiene:  Dolor al [employer].  Enrojecimiento, hinchazn o ms dolor alrededor de una incisin.  Sangre o lquido provenientes de una incisin.  Pus o mal olor provenientes de una incisin.  Una incisin que est caliente al tacto.  Cristy Hilts.  Dolor que empeora o que no mejora con los medicamentos.  Una incisin que comienza a abrirse.  Erupcin cutnea.  Desvanecimiento.  Nuseas y vmitos. Solicite  ayuda inmediatamente si:  Printmaker pecho o la pierna.  Comienza a IT sales professional.  Se desmaya.  Tiene sangrado vaginal abundante o mayor del que tena, lo suficiente como empapar un apsito en una hora. Resumen  Despus del procedimiento, es comn sentirse cansada, sentir algo de Social research officer, government en el abdomen y Best boy un sangrado vaginal leve durante [employer].  Siga las instrucciones del mdico acerca del cuidado de las incisiones.  Retome sus actividades normales segn lo indicado por el mdico. Pregntele al mdico qu actividades son seguras para usted.  No se haga duchas vaginales, no use tampones ni tenga relaciones sexuales hasta que su mdico lo autorice.  Concurra a [place]  como se lo haya indicado el mdico. Esta informacin no tiene Marine scientist el consejo del mdico. Asegrese de hacerle al mdico cualquier pregunta que tenga. Document Revised: 02/14/2018 Document Reviewed: 02/14/2018 Elsevier Patient Education  2021 [place]illo general en adultos, cuidados posteriores General Anesthesia, Adult, Care After Esta hoja le brinda informacin sobre cmo cuidarse despus del procedimiento. El mdico tambin podr darle instrucciones ms especficas. Comunquese con el mdico si tiene problemas o preguntas. Qu puedo esperar despus del procedimiento? Luego del procedimiento, son comunes los siguiente efectos secundarios:  Dolor o Scientist, research (life sciences) en el lugar de la va intravenosa (i.v.).  Nuseas.  Vmitos.  El dolor de Investment banker, operational.  Dificultad para concentrarte.  Sentir fro o [employer].  Sensacin de debilidad o cansancio.  Somnolencia y Programmer, applications.  Malestar y Hydrologist. Estos efectos secundarios pueden afectar partes del cuerpo que no estuvieron involucradas en la ciruga. Siga estas instrucciones en su casa: Durante el perodo de [employer] le haya indicado el mdico:  Descanse.  No participe en  actividades que impliquen posibles cadas o lesiones.  No conduzca ni opere maquinaria.  No beba alcohol.  No tome somnferos ni medicamentos que causen somnolencia.  No tome decisiones trascendentes ni firme documentos importantes.  No cuide a nios por su cuenta.   Comida y bebida  Siga las indicaciones del mdico respecto de las restricciones de comidas o bebidas.  Cuando [employer], comience a comer cantidades pequeas de alimentos que sean blandos y fciles de Publishing copy (livianos), como una tostada. Retome su dieta habitual de forma gradual.  Beber suficiente lquido como para mantener la orina de color amarillo plido.  Si vomita, rehidrtese tomando agua, jugo o caldo transparente. Instrucciones generales  Si tiene apnea del sueo, la [place] y ciertos medicamentos pueden elevar su riesgo de tener problemas respiratorios. Siga las instrucciones del mdico respecto al uso del dispositivo para dormir: ? Siempre que duerma, incluso durante las siestas que tome en el da. ? Mientras tome analgsicos recetados, medicamentos para dormir o medicamentos que producen somnolencia.  Pida a un adulto responsable que se quede con usted durante el tiempo que se le indique. Es importante tener a alguien que lo ayude a cuidarse hasta que est despierto y Press photographer.  Retome sus actividades normales segn lo indicado por el mdico. Pregntele al mdico qu actividades son seguras para usted.  Use los medicamentos de venta libre y los recetados solamente como se lo haya indicado el mdico.  Si fuma, no lo haga sin supervisin.  Concurra a todas las visitas de seguimiento como se lo haya indicado el mdico. Esto es importante. Comunquese con un mdico si:  Tiene nuseas o vmitos que no mejoran con medicamentos.  No puede comer ni beber sin vomitar.  Su dolor no se alivia con medicamentos.  No puede orinar.  Tiene una erupcin cutnea.  Tiene fiebre.  Presenta enrojecimiento  alrededor del lugar de la va intravenosa (i.v.) que empeora. Solicite ayuda de inmediato si:  Tienes dificultad para respirar.  Sientes dolor en el pecho.  Observa sangre en la orina o heces, o vomita sangre. Resumen  Despus del procedimiento, es comn tener dolor de garganta y nuseas. Tambin es comn sentirse cansado.  Pida a un adulto responsable que se quede con usted durante el tiempo que se le indique. Es importante tener a alguien que lo ayude a cuidarse hasta que est despierto y Press photographer.  Cuando tenga hambre, comience a comer cantidades pequeas de alimentos que sean blandos  y fciles de Publishing copy (livianos), como una tostada. Retome su dieta habitual de forma gradual.  Beber suficiente lquido como para mantener la orina de color amarillo plido.  Retome sus actividades normales segn lo indicado por el mdico. Pregntele al mdico qu actividades son seguras para usted. Esta informacin no tiene Marine scientist el consejo del mdico. Asegrese de hacerle al mdico cualquier pregunta que tenga. Document Revised: 07/18/2019 Document Reviewed: 07/18/2019 Elsevier Patient Education  [place].

## 2020-07-07 ENCOUNTER — Encounter (HOSPITAL_COMMUNITY)
Admission: RE | Admit: 2020-07-07 | Discharge: 2020-07-07 | Disposition: A | Discharge status: Home / Self Care | Payer: BC Managed Care – PPO | Source: Ambulatory Visit | Attending: Obstetrics & Gynecology | Admitting: Obstetrics & Gynecology

## 2020-07-07 ENCOUNTER — Other Ambulatory Visit: Payer: Self-pay

## 2020-07-07 ENCOUNTER — Encounter (HOSPITAL_COMMUNITY): Payer: Self-pay

## 2020-07-07 ENCOUNTER — Other Ambulatory Visit (HOSPITAL_COMMUNITY)
Admission: RE | Admit: 2020-07-07 | Discharge: 2020-07-07 | Disposition: A | Discharge status: Home / Self Care | Payer: BC Managed Care – PPO | Source: Ambulatory Visit | Attending: Obstetrics & Gynecology | Admitting: Obstetrics & Gynecology

## 2020-07-07 ENCOUNTER — Other Ambulatory Visit: Payer: Self-pay | Admitting: Obstetrics & Gynecology

## 2020-07-07 DIAGNOSIS — Z79899 Other long term (current) drug therapy: Secondary | ICD-10-CM | POA: Diagnosis not present

## 2020-07-07 DIAGNOSIS — Z01812 Encounter for preprocedural laboratory examination: Secondary | ICD-10-CM | POA: Insufficient documentation

## 2020-07-07 DIAGNOSIS — Z8249 Family history of ischemic heart disease and other diseases of the circulatory system: Secondary | ICD-10-CM | POA: Diagnosis not present

## 2020-07-07 DIAGNOSIS — Z791 Long term (current) use of non-steroidal anti-inflammatories (NSAID): Secondary | ICD-10-CM | POA: Diagnosis not present

## 2020-07-07 DIAGNOSIS — Z302 Encounter for sterilization: Secondary | ICD-10-CM | POA: Diagnosis not present

## 2020-07-07 DIAGNOSIS — Z833 Family history of diabetes mellitus: Secondary | ICD-10-CM | POA: Diagnosis not present

## 2020-07-07 DIAGNOSIS — N838 Other noninflammatory disorders of ovary, fallopian tube and broad ligament: Secondary | ICD-10-CM | POA: Diagnosis not present

## 2020-07-07 LAB — URINALYSIS, ROUTINE W REFLEX MICROSCOPIC
Bilirubin Urine: NEGATIVE
Glucose, UA: NEGATIVE mg/dL
Hgb urine dipstick: NEGATIVE
Ketones, ur: NEGATIVE mg/dL
Nitrite: NEGATIVE
Protein, ur: NEGATIVE mg/dL
Specific Gravity, Urine: 1.016 (ref 1.005–1.030)
pH: 5 (ref 5.0–8.0)

## 2020-07-07 LAB — COMPREHENSIVE METABOLIC PANEL
ALT: 28 U/L (ref 0–44)
AST: 22 U/L (ref 15–41)
Albumin: 3.8 g/dL (ref 3.5–5.0)
Alkaline Phosphatase: 128 U/L — ABNORMAL HIGH (ref 38–126)
Anion gap: 7 (ref 5–15)
BUN: 16 mg/dL (ref 6–20)
CO2: 24 mmol/L (ref 22–32)
Calcium: 8.3 mg/dL — ABNORMAL LOW (ref 8.9–10.3)
Chloride: 105 mmol/L (ref 98–111)
Creatinine, Ser: 0.71 mg/dL (ref 0.44–1.00)
GFR, Estimated: >60 mL/min (ref >60)
Glucose, Bld: 93 mg/dL (ref 70–99)
Potassium: 3.4 mmol/L — ABNORMAL LOW (ref 3.5–5.1)
Sodium: 136 mmol/L (ref 135–145)
Total Bilirubin: 0.6 mg/dL (ref 0.3–1.2)
Total Protein: 6.7 g/dL (ref 6.5–8.1)

## 2020-07-07 LAB — CBC
HCT: 41.2 % (ref 36.0–46.0)
Hemoglobin: 13.4 g/dL (ref 12.0–15.0)
MCH: 30.9 pg (ref 26.0–34.0)
MCHC: 32.5 g/dL (ref 30.0–36.0)
MCV: 95.2 fL (ref 80.0–100.0)
Platelets: 257 10*3/uL (ref 150–400)
RBC: 4.33 MIL/uL (ref 3.87–5.11)
RDW: 13.3 % (ref 11.5–15.5)
WBC: 7.4 10*3/uL (ref 4.0–10.5)
nRBC: 0 % (ref 0.0–0.2)

## 2020-07-07 LAB — RAPID HIV SCREEN (HIV 1/2 AB+AG)
HIV 1/2 Antibodies: NONREACTIVE
HIV-1 P24 Antigen - HIV24: NONREACTIVE

## 2020-07-07 LAB — HCG, QUANTITATIVE, PREGNANCY: hCG, Beta Chain, Quant, S: <1 m[IU]/mL (ref <5)

## 2020-07-08 ENCOUNTER — Ambulatory Visit (HOSPITAL_COMMUNITY): Payer: BC Managed Care – PPO | Admitting: Anesthesiology

## 2020-07-08 ENCOUNTER — Encounter (HOSPITAL_COMMUNITY): Payer: Self-pay | Admitting: Obstetrics & Gynecology

## 2020-07-08 ENCOUNTER — Other Ambulatory Visit: Payer: Self-pay

## 2020-07-08 ENCOUNTER — Ambulatory Visit (HOSPITAL_COMMUNITY)
Admission: RE | Admit: 2020-07-08 | Discharge: 2020-07-08 | Disposition: A | Discharge status: Home / Self Care | Payer: BC Managed Care – PPO | Attending: Obstetrics & Gynecology | Admitting: Obstetrics & Gynecology

## 2020-07-08 ENCOUNTER — Encounter (HOSPITAL_COMMUNITY)
Admission: RE | Disposition: A | Discharge status: Home / Self Care | Payer: Self-pay | Source: Home / Self Care | Attending: Obstetrics & Gynecology

## 2020-07-08 DIAGNOSIS — Z791 Long term (current) use of non-steroidal anti-inflammatories (NSAID): Secondary | ICD-10-CM | POA: Diagnosis not present

## 2020-07-08 DIAGNOSIS — Z3046 Encounter for surveillance of implantable subdermal contraceptive: Secondary | ICD-10-CM

## 2020-07-08 DIAGNOSIS — Z8249 Family history of ischemic heart disease and other diseases of the circulatory system: Secondary | ICD-10-CM | POA: Insufficient documentation

## 2020-07-08 DIAGNOSIS — N838 Other noninflammatory disorders of ovary, fallopian tube and broad ligament: Secondary | ICD-10-CM | POA: Diagnosis not present

## 2020-07-08 DIAGNOSIS — Z302 Encounter for sterilization: Secondary | ICD-10-CM | POA: Insufficient documentation

## 2020-07-08 DIAGNOSIS — Z833 Family history of diabetes mellitus: Secondary | ICD-10-CM | POA: Insufficient documentation

## 2020-07-08 DIAGNOSIS — Z79899 Other long term (current) drug therapy: Secondary | ICD-10-CM | POA: Diagnosis not present

## 2020-07-08 HISTORY — PX: NORPLANT REMOVAL: SHX5385

## 2020-07-08 HISTORY — PX: LAPAROSCOPIC BILATERAL SALPINGECTOMY: SHX5889

## 2020-07-08 SURGERY — SALPINGECTOMY, BILATERAL, LAPAROSCOPIC
Anesthesia: General | Site: Arm Upper | Laterality: Left

## 2020-07-08 MED ORDER — CHLORHEXIDINE GLUCONATE 0.12 % MT SOLN
15.0000 mL | Freq: Once | OROMUCOSAL | Status: AC
  Administered 2020-07-08: 15 mL via OROMUCOSAL

## 2020-07-08 MED ORDER — LIDOCAINE HCL (CARDIAC) PF 100 MG/5ML IV SOSY
PREFILLED_SYRINGE | INTRAVENOUS | Status: DC | PRN
  Administered 2020-07-08: 100 mg via INTRAVENOUS

## 2020-07-08 MED ORDER — POVIDONE-IODINE 10 % EX SWAB: 2.0000 "application " | Freq: Once | CUTANEOUS | Status: DC

## 2020-07-08 MED ORDER — FENTANYL CITRATE (PF) 100 MCG/2ML IJ SOLN
INTRAMUSCULAR | Status: DC | PRN
  Administered 2020-07-08: 100 ug via INTRAVENOUS

## 2020-07-08 MED ORDER — FENTANYL CITRATE (PF) 100 MCG/2ML IJ SOLN
25.0000 ug | INTRAMUSCULAR | Status: DC | PRN
  Administered 2020-07-08: 25 ug via INTRAVENOUS
  Filled 2020-07-08: qty 2

## 2020-07-08 MED ORDER — BUPIVACAINE LIPOSOME 1.3 % IJ SUSP
INTRAMUSCULAR | Status: AC
  Filled 2020-07-08: qty 20

## 2020-07-08 MED ORDER — MIDAZOLAM HCL 2 MG/2ML IJ SOLN
INTRAMUSCULAR | Status: AC
  Filled 2020-07-08: qty 2

## 2020-07-08 MED ORDER — HYDROCODONE-ACETAMINOPHEN 5-325 MG PO TABS: 1.0000 | ORAL_TABLET | Freq: Four times a day (QID) | ORAL | 0 refills | Status: DC | PRN

## 2020-07-08 MED ORDER — SODIUM CHLORIDE 0.9 % IR SOLN
Status: DC | PRN
  Administered 2020-07-08: 1000 mL

## 2020-07-08 MED ORDER — FENTANYL CITRATE (PF) 250 MCG/5ML IJ SOLN
INTRAMUSCULAR | Status: AC
  Filled 2020-07-08: qty 5

## 2020-07-08 MED ORDER — ROCURONIUM BROMIDE 100 MG/10ML IV SOLN
INTRAVENOUS | Status: DC | PRN
  Administered 2020-07-08: 25 mg via INTRAVENOUS
  Administered 2020-07-08: 5 mg via INTRAVENOUS

## 2020-07-08 MED ORDER — ONDANSETRON 8 MG PO TBDP: 8.0000 mg | ORAL_TABLET | Freq: Three times a day (TID) | ORAL | 0 refills | Status: DC | PRN

## 2020-07-08 MED ORDER — ONDANSETRON HCL 4 MG/2ML IJ SOLN
INTRAMUSCULAR | Status: AC
  Filled 2020-07-08: qty 2

## 2020-07-08 MED ORDER — MIDAZOLAM HCL 5 MG/5ML IJ SOLN
INTRAMUSCULAR | Status: DC | PRN
  Administered 2020-07-08: 2 mg via INTRAVENOUS

## 2020-07-08 MED ORDER — ORAL CARE MOUTH RINSE: 15.0000 mL | Freq: Once | OROMUCOSAL | Status: AC

## 2020-07-08 MED ORDER — BUPIVACAINE LIPOSOME 1.3 % IJ SUSP
20.0000 mL | Freq: Once | INTRAMUSCULAR | Status: DC
  Filled 2020-07-08: qty 20

## 2020-07-08 MED ORDER — SUCCINYLCHOLINE CHLORIDE 20 MG/ML IJ SOLN
INTRAMUSCULAR | Status: DC | PRN
  Administered 2020-07-08: 120 mg via INTRAVENOUS

## 2020-07-08 MED ORDER — BUPIVACAINE LIPOSOME 1.3 % IJ SUSP
INTRAMUSCULAR | Status: DC | PRN
  Administered 2020-07-08: 20 mL

## 2020-07-08 MED ORDER — DEXAMETHASONE SODIUM PHOSPHATE 10 MG/ML IJ SOLN
INTRAMUSCULAR | Status: DC | PRN
  Administered 2020-07-08: 10 mg via INTRAVENOUS

## 2020-07-08 MED ORDER — ONDANSETRON HCL 4 MG/2ML IJ SOLN: 4.0000 mg | Freq: Once | INTRAMUSCULAR | Status: DC | PRN

## 2020-07-08 MED ORDER — SUGAMMADEX SODIUM 200 MG/2ML IV SOLN
INTRAVENOUS | Status: DC | PRN
  Administered 2020-07-08: 300 mg via INTRAVENOUS

## 2020-07-08 MED ORDER — CEFAZOLIN SODIUM-DEXTROSE 2-4 GM/100ML-% IV SOLN
2.0000 g | INTRAVENOUS | Status: AC
  Administered 2020-07-08: 2 g via INTRAVENOUS
  Filled 2020-07-08: qty 100

## 2020-07-08 MED ORDER — PROPOFOL 10 MG/ML IV BOLUS
INTRAVENOUS | Status: DC | PRN
  Administered 2020-07-08: 200 mg via INTRAVENOUS

## 2020-07-08 MED ORDER — KETOROLAC TROMETHAMINE 10 MG PO TABS: 10.0000 mg | ORAL_TABLET | Freq: Three times a day (TID) | ORAL | 0 refills | Status: DC | PRN

## 2020-07-08 MED ORDER — SUGAMMADEX SODIUM 500 MG/5ML IV SOLN
INTRAVENOUS | Status: AC
  Filled 2020-07-08: qty 5

## 2020-07-08 MED ORDER — PROPOFOL 10 MG/ML IV BOLUS
INTRAVENOUS | Status: AC
  Filled 2020-07-08: qty 20

## 2020-07-08 MED ORDER — DEXAMETHASONE SODIUM PHOSPHATE 10 MG/ML IJ SOLN
INTRAMUSCULAR | Status: AC
  Filled 2020-07-08: qty 1

## 2020-07-08 MED ORDER — ONDANSETRON HCL 4 MG/2ML IJ SOLN
INTRAMUSCULAR | Status: DC | PRN
  Administered 2020-07-08: 4 mg via INTRAVENOUS

## 2020-07-08 MED ORDER — KETOROLAC TROMETHAMINE 30 MG/ML IJ SOLN
30.0000 mg | Freq: Once | INTRAMUSCULAR | Status: AC
  Administered 2020-07-08: 30 mg via INTRAVENOUS
  Filled 2020-07-08: qty 1

## 2020-07-08 MED ORDER — LACTATED RINGERS IV SOLN: INTRAVENOUS | Status: DC

## 2020-07-08 SURGICAL SUPPLY — 38 items
BAG HAMPER (MISCELLANEOUS) ×4 IMPLANT
BLADE SURG SZ11 CARB STEEL (BLADE) ×4 IMPLANT
BNDG COHESIVE 2X5 TAN STRL LF (GAUZE/BANDAGES/DRESSINGS) ×4 IMPLANT
CLOTH BEACON ORANGE TIMEOUT ST (SAFETY) ×4 IMPLANT
COVER LIGHT HANDLE STERIS (MISCELLANEOUS) ×8 IMPLANT
COVER WAND RF STERILE (DRAPES) ×4 IMPLANT
DERMABOND ADVANCED (GAUZE/BANDAGES/DRESSINGS) ×2
DERMABOND ADVANCED .7 DNX12 (GAUZE/BANDAGES/DRESSINGS) ×6 IMPLANT
ELECT REM PT RETURN 9FT ADLT (ELECTROSURGICAL) ×4
ELECTRODE REM PT RTRN 9FT ADLT (ELECTROSURGICAL) ×3 IMPLANT
GAUZE 4X4 16PLY RFD (DISPOSABLE) ×4 IMPLANT
GLOVE ECLIPSE 8.0 STRL XLNG CF (GLOVE) ×4 IMPLANT
GLOVE SRG 8 PF TXTR STRL LF DI (GLOVE) ×3 IMPLANT
GLOVE SURG UNDER POLY LF SZ7 (GLOVE) ×12 IMPLANT
GLOVE SURG UNDER POLY LF SZ8 (GLOVE) ×1
GOWN STRL REUS W/TWL LRG LVL3 (GOWN DISPOSABLE) ×4 IMPLANT
GOWN STRL REUS W/TWL XL LVL3 (GOWN DISPOSABLE) ×4 IMPLANT
INST SET LAPROSCOPIC GYN AP (KITS) ×4 IMPLANT
KIT TURNOVER CYSTO (KITS) ×4 IMPLANT
NEEDLE HYPO 18GX1.5 BLUNT FILL (NEEDLE) ×4 IMPLANT
NEEDLE HYPO 21X1.5 SAFETY (NEEDLE) ×4 IMPLANT
NEEDLE INSUFFLATION 14GA 120MM (NEEDLE) ×4 IMPLANT
PACK PERI GYN (CUSTOM PROCEDURE TRAY) ×4 IMPLANT
PAD ARMBOARD 7.5X6 YLW CONV (MISCELLANEOUS) ×4 IMPLANT
SET BASIN LINEN APH (SET/KITS/TRAYS/PACK) ×4 IMPLANT
SET TUBE SMOKE EVAC HIGH FLOW (TUBING) ×4 IMPLANT
SHEARS HARMONIC ACE PLUS 36CM (ENDOMECHANICALS) ×4 IMPLANT
SLEEVE ENDOPATH XCEL 5M (ENDOMECHANICALS) ×4 IMPLANT
SOL ANTI FOG 6CC (MISCELLANEOUS) ×3 IMPLANT
SOLUTION ANTI FOG 6CC (MISCELLANEOUS) ×1
SPONGE GAUZE 2X2 8PLY STRL LF (GAUZE/BANDAGES/DRESSINGS) ×8 IMPLANT
SUT VICRYL 0 UR6 27IN ABS (SUTURE) ×4 IMPLANT
SUT VICRYL AB 3-0 FS1 BRD 27IN (SUTURE) ×8 IMPLANT
SYR 10ML LL (SYRINGE) ×4 IMPLANT
SYR 20ML LL LF (SYRINGE) ×8 IMPLANT
TROCAR ENDO BLADELESS 11MM (ENDOMECHANICALS) ×4 IMPLANT
TROCAR XCEL NON-BLD 5MMX100MML (ENDOMECHANICALS) ×4 IMPLANT
WARMER LAPAROSCOPE (MISCELLANEOUS) ×4 IMPLANT

## 2020-07-08 NOTE — Anesthesia Preprocedure Evaluation (Addendum)
Anesthesia Evaluation  Patient identified by MRN, date of birth, ID band Patient awake    Reviewed: Allergy & Precautions, H&P , NPO status , Patient's Chart, lab work & pertinent test results, reviewed documented beta blocker date and time   Airway Mallampati: II  TM Distance: >3 FB Neck ROM: full    Dental no notable dental hx.    Pulmonary neg pulmonary ROS, Patient abstained from smoking.,    Pulmonary exam normal breath sounds clear to auscultation       Cardiovascular Exercise Tolerance: Good negative cardio ROS   Rhythm:regular Rate:Normal     Neuro/Psych negative neurological ROS  negative psych ROS   GI/Hepatic negative GI ROS, Neg liver ROS,   Endo/Other  negative endocrine ROS  Renal/GU negative Renal ROS  negative genitourinary   Musculoskeletal   Abdominal   Peds  Hematology negative hematology ROS (+)   Anesthesia Other Findings   Reproductive/Obstetrics negative OB ROS                             Anesthesia Physical Anesthesia Plan  ASA: II  Anesthesia Plan: General   Post-op Pain Management:    Induction:   PONV Risk Score and Plan: Ondansetron  Airway Management Planned: Oral ETT  Additional Equipment:   Intra-op Plan:   Post-operative Plan:   Informed Consent: I have reviewed the patients History and Physical, chart, labs and discussed the procedure including the risks, benefits and alternatives for the proposed anesthesia with the patient or authorized representative who has indicated his/her understanding and acceptance.     Dental Advisory Given  Plan Discussed with: CRNA  Anesthesia Plan Comments:        Anesthesia Quick Evaluation

## 2020-07-08 NOTE — Op Note (Signed)
Preoperative Diagnosis:  1.  Multiparous female desires permanent sterilization                                          2.  Elects to have bilateral salpingectomy for ovarian cancer prophylaxis                                          3.  Desires nexplanon devices to be removed  Postoperative Diagnosis:  Same as above  Procedure:  Laparoscopic Bilateral Salpingectomy for the purpose of permanent sterilization, removal of 2 nexplanons in the left arm  Surgeon:  Jacelyn Grip MD  Anaesthesia: general  Findings:  Patient had normal pelvic anatomy and no intraperitoneal abnormalities.  Description of Operation:  Patient was taken to the OR and placed into supine position where she underwent general anaesthesia.   She was placed in the dorsal lithotomy position and prepped and draped in the usual sterile fashion.   An incision was made in the umbilicus and dissection taken down to the rectus fascia. A Veres needle was used to insufflate the periotneal cavity. An 11 mm non bladed video laparoscope trocar was then placed under direct visualization without difficulty.   The above noted findings were observed.   Two additional 5 mm non bladed trocars were placed in the right and left lower quadrants under direct visualization without difficulty.   The Harmonic scalpel was employed and a salpingectomy of both the right and left tubes was performed.   The tubes were removed from the peritoneal cavity and sent to pathology.   There was good hemostasis bilaterally.   The fascia, peritoneum and subcutaneous tissue were closed using 0 vicryl.   All 3 skin incisions were closed using 3-0 vicryl in a subcuticular fashion.  Exparel 266 mg 20 cc was injected in the 3 incisional/trocar sites.   The left upper arm was prepped A stab incision was made and a hemostat used to remove both nexplanon devices Closed with dermabond Dressed with 2x2 and coban  The patient was awakened from anaesthesia and  taken to the PACU with all counts being correct x 3.   The patient received  2 gram of ancef andToradol 30 mg IV preoperatively.  Florian Buff 07/08/2020 11:43 AM

## 2020-07-08 NOTE — H&P (Signed)
Preoperative History and Physical  Julie Mckay is a 35 y.o. Y1P5093 with No LMP recorded. Patient has had an implant. admitted for a laparoscopic bilateral salpingectomy and removal of 2 nexplanons in the left arm.  Desires permanent sterilization and opts for ovarian cancer prophylaxis with bilateral salpingectomy  PMH:    Past Medical History:  Diagnosis Date  . Bilateral ovarian cysts     PSH:     Past Surgical History:  Procedure Laterality Date  . NO PAST SURGERIES      POb/GynH:      OB History    Gravida  2   Para  2   Term  1   Preterm  1   AB      Living  2     SAB      IAB      Ectopic      Multiple      Live Births  2           SH:   Social History   Tobacco Use  . Smoking status: Never Smoker  . Smokeless tobacco: Never Used  Vaping Use  . Vaping Use: Some days  Substance Use Topics  . Alcohol use: Yes    Comment: sometimes  . Drug use: Never    FH:    Family History  Problem Relation Age of Onset  . Diabetes Maternal Grandmother   . Hypertension Father      Allergies: No Known Allergies  Medications:       Current Facility-Administered Medications:  .  bupivacaine liposome (EXPAREL) 1.3 % injection 266 mg, 20 mL, Infiltration, Once, Kylen Ismael H, MD .  ceFAZolin (ANCEF) IVPB 2g/100 mL premix, 2 g, Intravenous, On Call to OR, Florian Buff, MD .  ketorolac (TORADOL) 30 MG/ML injection 30 mg, 30 mg, Intravenous, Once, Florian Buff, MD .  povidone-iodine 10 % swab 2 application, 2 application, Topical, Once, Florian Buff, MD  Review of Systems:   Review of Systems  Constitutional: Negative for fever, chills, weight loss, malaise/fatigue and diaphoresis.  HENT: Negative for hearing loss, ear pain, nosebleeds, congestion, sore throat, neck pain, tinnitus and ear discharge.   Eyes: Negative for blurred vision, double vision, photophobia, pain, discharge and redness.  Respiratory: Negative for cough,  hemoptysis, sputum production, shortness of breath, wheezing and stridor.   Cardiovascular: Negative for chest pain, palpitations, orthopnea, claudication, leg swelling and PND.  Gastrointestinal: Positive for abdominal pain. Negative for heartburn, nausea, vomiting, diarrhea, constipation, blood in stool and melena.  Genitourinary: Negative for dysuria, urgency, frequency, hematuria and flank pain.  Musculoskeletal: Negative for myalgias, back pain, joint pain and falls.  Skin: Negative for itching and rash.  Neurological: Negative for dizziness, tingling, tremors, sensory change, speech change, focal weakness, seizures, loss of consciousness, weakness and headaches.  Endo/Heme/Allergies: Negative for environmental allergies and polydipsia. Does not bruise/bleed easily.  Psychiatric/Behavioral: Negative for depression, suicidal ideas, hallucinations, memory loss and substance abuse. The patient is not nervous/anxious and does not have insomnia.      PHYSICAL EXAM:  Blood pressure 106/77, pulse 82, temperature 97.7 F (36.5 C), temperature source Oral, resp. rate 14, SpO2 98 %.    Vitals reviewed. Constitutional: She is oriented to person, place, and time. She appears well-developed and well-nourished.  HENT:  Head: Normocephalic and atraumatic.  Right Ear: External ear normal.  Left Ear: External ear normal.  Nose: Nose normal.  Mouth/Throat: Oropharynx is clear and moist.  Eyes:  Conjunctivae and EOM are normal. Pupils are equal, round, and reactive to light. Right eye exhibits no discharge. Left eye exhibits no discharge. No scleral icterus.  Neck: Normal range of motion. Neck supple. No tracheal deviation present. No thyromegaly present.  Cardiovascular: Normal rate, regular rhythm, normal heart sounds and intact distal pulses.  Exam reveals no gallop and no friction rub.   No murmur heard. Respiratory: Effort normal and breath sounds normal. No respiratory distress. She has no  wheezes. She has no rales. She exhibits no tenderness.  GI: Soft. Bowel sounds are normal. She exhibits no distension and no mass. There is tenderness. There is no rebound and no guarding.  Genitourinary:       Vulva is normal without lesions Vagina is pink moist without discharge Cervix normal in appearance and pap is normal Uterus is normal size, contour, position, consistency, mobility, non-tender Adnexa is negative with normal sized ovaries by sonogram  Musculoskeletal: Normal range of motion. She exhibits no edema and no tenderness.  Neurological: She is alert and oriented to person, place, and time. She has normal reflexes. She displays normal reflexes. No cranial nerve deficit. She exhibits normal muscle tone. Coordination normal.  Skin: Skin is warm and dry. No rash noted. No erythema. No pallor.  Psychiatric: She has a normal mood and affect. Her behavior is normal. Judgment and thought content normal.    Labs: Results for orders placed or performed during the hospital encounter of 07/07/20 (from the past 336 hour(s))  CBC   Collection Time: 07/07/20  9:00 AM  Result Value Ref Range   WBC 7.4 4.0 - 10.5 K/uL   RBC 4.33 3.87 - 5.11 MIL/uL   Hemoglobin 13.4 12.0 - 15.0 g/dL   HCT 41.2 36.0 - 46.0 %   MCV 95.2 80.0 - 100.0 fL   MCH 30.9 26.0 - 34.0 pg   MCHC 32.5 30.0 - 36.0 g/dL   RDW 13.3 11.5 - 15.5 %   Platelets 257 150 - 400 K/uL   nRBC 0.0 0.0 - 0.2 %  Comprehensive metabolic panel   Collection Time: 07/07/20  9:00 AM  Result Value Ref Range   Sodium 136 135 - 145 mmol/L   Potassium 3.4 (L) 3.5 - 5.1 mmol/L   Chloride 105 98 - 111 mmol/L   CO2 24 22 - 32 mmol/L   Glucose, Bld 93 70 - 99 mg/dL   BUN 16 6 - 20 mg/dL   Creatinine, Ser 0.71 0.44 - 1.00 mg/dL   Calcium 8.3 (L) 8.9 - 10.3 mg/dL   Total Protein 6.7 6.5 - 8.1 g/dL   Albumin 3.8 3.5 - 5.0 g/dL   AST 22 15 - 41 U/L   ALT 28 0 - 44 U/L   Alkaline Phosphatase 128 (H) 38 - 126 U/L   Total Bilirubin 0.6 0.3  - 1.2 mg/dL   GFR, Estimated >60 >60 mL/min   Anion gap 7 5 - 15  hCG, quantitative, pregnancy   Collection Time: 07/07/20  9:00 AM  Result Value Ref Range   hCG, Beta Chain, Quant, S <1 <5 mIU/mL  Rapid HIV screen (HIV 1/2 Ab+Ag)   Collection Time: 07/07/20  9:00 AM  Result Value Ref Range   HIV-1 P24 Antigen - HIV24 NON REACTIVE NON REACTIVE   HIV 1/2 Antibodies NON REACTIVE NON REACTIVE   Interpretation (HIV Ag Ab)      A non reactive test result means that HIV 1 or HIV 2 antibodies and HIV 1 p24  antigen were not detected in the specimen.  Urinalysis, Routine w reflex microscopic Urine, Clean Catch   Collection Time: 07/07/20  9:00 AM  Result Value Ref Range   Color, Urine YELLOW YELLOW   APPearance CLOUDY (A) CLEAR   Specific Gravity, Urine 1.016 1.005 - 1.030   pH 5.0 5.0 - 8.0   Glucose, UA NEGATIVE NEGATIVE mg/dL   Hgb urine dipstick NEGATIVE NEGATIVE   Bilirubin Urine NEGATIVE NEGATIVE   Ketones, ur NEGATIVE NEGATIVE mg/dL   Protein, ur NEGATIVE NEGATIVE mg/dL   Nitrite NEGATIVE NEGATIVE   Leukocytes,Ua TRACE (A) NEGATIVE   RBC / HPF 0-5 0 - 5 RBC/hpf   WBC, UA 0-5 0 - 5 WBC/hpf   Bacteria, UA RARE (A) NONE SEEN   Squamous Epithelial / LPF 21-50 0 - 5    EKG: No orders found for this or any previous visit.  Imaging Studies: No results found.    Assessment: Patient Active Problem List   Diagnosis Date Noted  . BMI 31.0-31.9,adult 04/09/2019  . Implantable subdermal contraceptive surveillance 04/09/2019  . Language barrier 04/09/2019  . History of gestational diabetes 04/09/2019    Plan: Laparoscopic bilateral salpingectomy for sterilization and ovarian cancer prophylaxis Removal of 2 nexplanons in the left arm  Florian Buff 07/08/2020 10:16 AM

## 2020-07-08 NOTE — Discharge Instructions (Signed)
Laparoscopic Tubal Ligation, Care After The following information offers guidance on how to care for yourself after your procedure. Your health care provider may also give you more specific instructions. If you have problems or questions, contact your health care provider. What can I expect after the procedure? After the procedure, it is common to have:  A sore throat if general anesthesia was used.  Pain in shoulders, back, and abdomen. This is caused by the gas that was used during the procedure.  Mild discomfort or cramping in your abdomen.  Pain or soreness in the area where the surgical incision was made.  A bloated feeling.  Tiredness.  Nausea and vomiting. Follow these instructions at home: Medicines  Take over-the-counter and prescription medicines only as told by your health care provider.  Ask your health care provider if the medicine prescribed to you: ? Requires you to avoid driving or using heavy machinery. ? Can cause constipation. You may need to take these actions to prevent or treat constipation:  Drink enough fluid to keep your urine pale yellow.  Take over-the-counter or prescription medicines.  Eat foods that are high in fiber, such as beans, whole grains, and fresh fruits and vegetables.  Limit foods that are high in fat and processed sugars, such as fried or sweet foods.  Do not take aspirin because it can cause bleeding. Incision care  Follow instructions from your health care provider about how to take care of your incision. Make sure you: ? Wash your hands with soap and water for at least 20 seconds before and after you change your bandage (dressing). If soap and water are not available, use hand sanitizer. ? Change your dressing as told by your health care provider. ? Leave stitches (sutures), skin glue, or adhesive strips in place. These skin closures may need to stay in place for 2 weeks or longer. If adhesive strip edges start to loosen and curl  up, you may trim the loose edges. Do not remove adhesive strips completely unless your health care provider tells you to do that.  Check your incision area every day for signs of infection. Check for: ? Redness, swelling, or more pain. ? Fluid or blood. ? Warmth. ? Pus or a bad smell.      Activity  Rest as told by your health care provider.  Avoid sitting for a long time without moving. Get up to take short walks every 1-2 hours. This is important to improve blood flow and breathing. Ask for help if you feel weak or unsteady.  Do not have sex, douche, or put a tampon or anything else in your vagina for 6 weeks or as long as told by your health care provider.  Do not lift anything that is heavier than your baby for 2 weeks, or the limit that you are told, until your health care provider says that it is safe.  Do not take baths, swim, or use a hot tub until your health care provider approves. Ask your health care provider if you may take showers. You may only be allowed to take sponge baths.  Return to your normal activities as told by your health care provider. Ask your health care provider what activities are safe for you. General instructions  After the procedure you may need to wear a sanitary pad for vaginal discharge.  Have someone help you with your daily household tasks for the first few days.  Keep all follow-up visits. This is important. Contact a health  care provider if:  You have redness, swelling, or more pain around your incision.  Your incision feels warm to the touch.  You have pus or a bad smell coming from your incision.  The edges of your incision break open after the sutures have been removed.  Your pain does not improve after 2-3 days.  You have a rash.  You repeatedly become dizzy or light-headed.  Your pain medicine is not helping. Get help right away if:  You have a fever or chills.  You faint.  You have increasing pain in your  abdomen.  You have severe pain in one or both of your shoulders.  You have fluid or blood coming from your sutures or heavy bleeding from your vagina.  You have shortness of breath or difficulty breathing.  You have chest pain, leg pain, or leg swelling.  You have ongoing nausea, vomiting, or diarrhea. These symptoms may represent a serious problem that is an emergency. Do not wait to see if the symptoms will go away. Get medical help right away. Call your local emergency services (911 in the [place] Do not drive yourself to the hospital. Summary  After the procedure, it is common to have mild discomfort or cramping in your abdomen.  After the procedure you may need to wear a sanitary pad for vaginal discharge.  Take over-the-counter and prescription medicines only as told by your health care provider.  Watch for symptoms that should prompt you to call your health care provider.  Keep all follow-up visits. This is important. This information is not intended to replace advice given to you by your health care provider. Make sure you discuss any questions you have with your health care provider. Document Revised: 10/11/2019 Document Reviewed: 10/11/2019 Elsevier Patient Education  2021 [place] After This sheet gives you information about how to care for yourself after your procedure. Your health care provider may also give you more specific instructions. If you have problems or questions, contact your health care provider. What can I expect after the procedure? After the procedure, it is common to have:  Tiredness.  Forgetfulness about what happened after the procedure.  Impaired judgment for important decisions.  Nausea or vomiting.  Some difficulty with balance. Follow these instructions at home: For the time period you were told by your health care provider:  Rest as needed.  Do not participate in activities where you could fall  or become injured.  Do not drive or use machinery.  Do not drink alcohol.  Do not take sleeping pills or medicines that cause drowsiness.  Do not make important decisions or sign legal documents.  Do not take care of children on your own.      Eating and drinking  Follow the diet that is recommended by your health care provider.  Drink enough fluid to keep your urine pale yellow.  If you vomit: ? Drink water, juice, or soup when you can drink without vomiting. ? Make sure you have little or no nausea before eating solid foods. General instructions  Have a responsible adult stay with you for the time you are told. It is important to have someone help care for you until you are awake and alert.  Take over-the-counter and prescription medicines only as told by your health care provider.  If you have sleep apnea, surgery and certain medicines can increase your risk for breathing problems. Follow instructions from your health care provider about wearing  your sleep device: ? Anytime you are sleeping, including during daytime naps. ? While taking prescription pain medicines, sleeping medicines, or medicines that make you drowsy.  Avoid smoking.  Keep all follow-up visits as told by your health care provider. This is important. Contact a health care provider if:  You keep feeling nauseous or you keep vomiting.  You feel light-headed.  You are still sleepy or having trouble with balance after 24 hours.  You develop a rash.  You have a fever.  You have redness or swelling around the IV site. Get help right away if:  You have trouble breathing.  You have new-onset confusion at home. Summary  For several hours after your procedure, you may feel tired. You may also be forgetful and have poor judgment.  Have a responsible adult stay with you for the time you are told. It is important to have someone help care for you until you are awake and alert.  Rest as told. Do not  drive or operate machinery. Do not drink alcohol or take sleeping pills.  Get help right away if you have trouble breathing, or if you suddenly become confused. This information is not intended to replace advice given to you by your health care provider. Make sure you discuss any questions you have with your health care provider. Document Revised: 10/10/2019 Document Reviewed: 12/27/2018 Elsevier Patient Education  2021 [place] trompas por laparoscopia, cuidados posteriores Laparoscopic Tubal Ligation, Care After La siguiente informacin ofrece orientacin sobre cmo cuidarse despus del procedimiento. El mdico tambin podr darle instrucciones ms especficas. Comunquese con el mdico si tiene problemas o preguntas. Qu puedo esperar despus del procedimiento? Despus del procedimiento, es normal tener los siguientes sntomas:  Dolor de garganta si se [place] anestesia general.  Dolor en los hombros, la espalda y el abdomen. Esto lo causa el gas que se [place] durante el procedimiento.  Calambres o molestias leves en el abdomen.  Dolor o molestias en la zona donde se realiz la incisin [place].  Sensacin de hinchazn.  Cansancio.  Nuseas y vmitos. Siga estas instrucciones en su casa: Medicamentos  Use los medicamentos de venta libre y los recetados solamente como se lo haya indicado el mdico.  Pregntele al mdico si el medicamento recetado: ? Hace necesario que evite conducir o usar maquinaria pesada. ? Puede causarle estreimiento. Es posible que tenga que tomar estas medidas para prevenir o tratar el estreimiento:  Electronics engineer suficiente lquido como para Theatre manager la orina de color amarillo plido.  Usar medicamentos recetados o de Radio broadcast assistant.  Consumir alimentos ricos en fibra, como frijoles, cereales integrales, y frutas y verduras frescas.  Limitar el consumo de alimentos ricos en grasa y azcares procesados, como los alimentos fritos o  dulces.  No tome aspirina, ya que puede causar hemorragias. Cuidado de la incisin  Siga las instrucciones del mdico acerca del cuidado de la incisin. Asegrese de hacer lo siguiente: ? Lvese las manos con agua y jabn durante al menos 20segundos antes y despus de cambiarse la venda (vendaje). Use desinfectante para manos si no dispone de [place] y [place]. ? Cambie el vendaje como se lo haya indicado el mdico. ? No retire los puntos (suturas), la goma para cerrar la piel o las tiras [place]. Es posible que estos cierres cutneos deban quedar puestos en la piel durante 2semanas o ms tiempo. Si los bordes de las tiras adhesivas empiezan a despegarse y Therapist, sports, puede [employer] que estn  sueltos. No retire las tiras [facility] por completo a menos que el mdico se lo indique.  Controle la zona de la incisin todos los das para detectar signos de infeccin. Est atenta a los siguientes signos: ? Enrojecimiento, hinchazn o ms dolor. ? Lquido o sangre. ? Calor. ? Pus o mal olor.      Actividad  Haga reposo como se lo haya indicado el mdico.  Evite estar sentada durante largos perodos sin moverse. Levntese y camine un poco cada 1 a 2 horas. Esto es importante para mejorar el flujo sanguneo y la respiracin. Pida ayuda si se siente dbil o inestable.  No mantenga relaciones sexuales, no se haga duchas vaginales ni se coloque un tampn o cualquier otro objeto en la vagina durante 6semanas o el tiempo que le haya indicado el mdico.  No levante ningn objeto que sea ms pesado que su beb, o el lmite de peso que le hayan indicado, durante 2semanas o hasta que el mdico le diga que puede [place].  No tome baos de inmersin, no nade ni use el jacuzzi hasta que el mdico lo autorice. Pregntele al mdico si puede ducharse. Thurston Pounds solo le permitan darse baos de [place].  Retome sus actividades normales segn lo indicado por el mdico. Pregntele al mdico qu actividades son  seguras para usted. Indicaciones generales  Despus del procedimiento, es posible que deba usar un apsito sanitario para las secreciones vaginales.  Pida a alguien que la ayude con las tareas [employer] diarias durante los primeros [place].  Cumpla con todas las visitas de seguimiento. Esto es importante. Comunquese con un mdico si:  Tiene enrojecimiento, hinchazn o ms dolor alrededor de la incisin.  La incisin est caliente al tacto.  Tiene pus o percibe que sale mal olor de su incisin.  Se le abren los bordes de la incisin despus de que le hayan sacado las suturas.  El dolor no mejora despus de 2a 3das.  Tiene una erupcin cutnea.  Tiene mareos o sensacin de desvanecimiento frecuentes.  El medicamento no Production designer, theatre/television/film. Solicite ayuda de inmediato si:  Tiene fiebre o escalofros.  Se desmaya.  Siente ms dolor en el abdomen.  Tiene un dolor intenso en uno o ambos hombros.  Observa lquido o sangre que sale de las suturas o sangrado abundante de la vagina.  Le falta el aire o tiene dificultad para respirar.  Tiene dolor torcico, dolor en las [place] piernas.  Tiene nuseas, vmitos o diarrea de forma continua. Estos sntomas pueden representar un problema grave que constituye Engineer, maintenance (IT). No espere a ver si los sntomas desaparecen. Solicite atencin mdica de inmediato. Comunquese con el servicio de emergencias de su localidad (911 en los [place]). No conduzca por sus propios medios [employer] hospital. Resumen  Despus del procedimiento, es frecuente sentir calambres o molestias leves en el abdomen.  Despus del procedimiento, es posible que deba usar un apsito sanitario para las secreciones vaginales.  Use los medicamentos de venta libre y los recetados solamente como se lo haya indicado el mdico.  Est atenta a los sntomas por los cuales debe comunicarse con su mdico.  Cumpla con todas las visitas de  seguimiento. Esto es importante. Esta informacin no tiene Marine scientist el consejo del mdico. Asegrese de hacerle al mdico cualquier pregunta que tenga. Document Revised: 12/03/2019 Document Reviewed: 12/03/2019 Elsevier Patient Education  2021 [place] general en adultos, cuidados posteriores General Anesthesia, Adult,  Care After Esta hoja le brinda informacin sobre cmo cuidarse despus del procedimiento. El mdico tambin podr darle instrucciones ms especficas. Comunquese con el mdico si tiene problemas o preguntas. Qu puedo esperar despus del procedimiento? Luego del procedimiento, son comunes los siguiente efectos secundarios:  Dolor o Scientist, research (life sciences) en el lugar de la va intravenosa (i.v.).  Nuseas.  Vmitos.  El dolor de Investment banker, operational.  Dificultad para concentrarte.  Sentir fro o [employer].  Sensacin de debilidad o cansancio.  Somnolencia y Programmer, applications.  Malestar y Hydrologist. Estos efectos secundarios pueden afectar partes del cuerpo que no estuvieron involucradas en la ciruga. Siga estas instrucciones en su casa: Durante el perodo de [employer] le haya indicado el mdico:  Descanse.  No participe en actividades que impliquen posibles cadas o lesiones.  No conduzca ni opere maquinaria.  No beba alcohol.  No tome somnferos ni medicamentos que causen somnolencia.  No tome decisiones trascendentes ni firme documentos importantes.  No cuide a nios por su cuenta.   Comida y bebida  Siga las indicaciones del mdico respecto de las restricciones de comidas o bebidas.  Cuando [employer], comience a comer cantidades pequeas de alimentos que sean blandos y fciles de Publishing copy (livianos), como una tostada. Retome su dieta habitual de forma gradual.  Beber suficiente lquido como para mantener la orina de color amarillo plido.  Si vomita, rehidrtese tomando agua, jugo o caldo transparente. Instrucciones generales  Si  tiene apnea del sueo, la [place] y ciertos medicamentos pueden elevar su riesgo de tener problemas respiratorios. Siga las instrucciones del mdico respecto al uso del dispositivo para dormir: ? Siempre que duerma, incluso durante las siestas que tome en el da. ? Mientras tome analgsicos recetados, medicamentos para dormir o medicamentos que producen somnolencia.  Pida a un adulto responsable que se quede con usted durante el tiempo que se le indique. Es importante tener a alguien que lo ayude a cuidarse hasta que est despierto y Press photographer.  Retome sus actividades normales segn lo indicado por el mdico. Pregntele al mdico qu actividades son seguras para usted.  Use los medicamentos de venta libre y los recetados solamente como se lo haya indicado el mdico.  Si fuma, no lo haga sin supervisin.  Concurra a todas las visitas de seguimiento como se lo haya indicado el mdico. Esto es importante. Comunquese con un mdico si:  Tiene nuseas o vmitos que no mejoran con medicamentos.  No puede comer ni beber sin vomitar.  Su dolor no se alivia con medicamentos.  No puede orinar.  Tiene una erupcin cutnea.  Tiene fiebre.  Presenta enrojecimiento alrededor del lugar de la va intravenosa (i.v.) que empeora. Solicite ayuda de inmediato si:  Tienes dificultad para respirar.  Sientes dolor en el pecho.  Observa sangre en la orina o heces, o vomita sangre. Resumen  Despus del procedimiento, es comn tener dolor de garganta y nuseas. Tambin es comn sentirse cansado.  Pida a un adulto responsable que se quede con usted durante el tiempo que se le indique. Es importante tener a alguien que lo ayude a cuidarse hasta que est despierto y Press photographer.  Cuando [employer], comience a comer cantidades pequeas de alimentos que sean blandos y fciles de Publishing copy (livianos), como una tostada. Retome su dieta habitual de forma gradual.  Beber suficiente lquido como para mantener  la orina de color amarillo plido.  Retome sus actividades normales segn lo indicado por el mdico. Pregntele al mdico qu actividades son seguras para usted. Esta  informacin no tiene Marine scientist el consejo del mdico. Asegrese de hacerle al mdico cualquier pregunta que tenga. Document Revised: 07/18/2019 Document Reviewed: 07/18/2019 Elsevier Patient Education  [place].

## 2020-07-08 NOTE — Anesthesia Procedure Notes (Signed)
Procedure Name: Intubation Date/Time: 07/08/2020 10:52 AM Performed by: Riki Sheer, CRNA Pre-anesthesia Checklist: Patient identified, Emergency Drugs available, Suction available, Patient being monitored and Timeout performed Patient Re-evaluated:Patient Re-evaluated prior to induction Oxygen Delivery Method: Circle system utilized Preoxygenation: Pre-oxygenation with 100% oxygen Induction Type: IV induction Ventilation: Mask ventilation without difficulty Laryngoscope Size: Miller and 2 Grade View: Grade I Tube type: Oral Tube size: 7.0 mm Number of attempts: 1 Airway Equipment and Method: Stylet Placement Confirmation: ETT inserted through vocal cords under direct vision,  positive ETCO2,  CO2 detector and breath sounds checked- equal and bilateral Secured at: 22 cm Tube secured with: Tape Dental Injury: Teeth and Oropharynx as per pre-operative assessment

## 2020-07-08 NOTE — Transfer of Care (Signed)
Immediate Anesthesia Transfer of Care Note  Patient: Julie Mckay  Procedure(s) Performed: LAPAROSCOPIC BILATERAL SALPINGECTOMY Procedure #1 (Bilateral ) REMOVAL OF NEXPLANON Procedure #2 (Left Arm Upper)  Patient Location: PACU  Anesthesia Type:General  Level of Consciousness: awake, alert  and oriented  Airway & Oxygen Therapy: Patient Spontanous Breathing and Patient connected to nasal cannula oxygen  Post-op Assessment: Report given to RN and Post -op Vital signs reviewed and stable  Post vital signs: Reviewed and stable  Last Vitals:  Vitals Value Taken Time  BP 104/75 07/08/20 1145  Temp    Pulse 70 07/08/20 1145  Resp 13 07/08/20 1145  SpO2 100 % 07/08/20 1145  Vitals shown include unvalidated device data.  Last Pain:  Vitals:   07/08/20 1003  TempSrc: Oral  PainSc: 3       Patients Stated Pain Goal: 6 (58/52/77 8242)  Complications: No complications documented.

## 2020-07-09 ENCOUNTER — Encounter (HOSPITAL_COMMUNITY): Payer: Self-pay | Admitting: Obstetrics & Gynecology

## 2020-07-09 LAB — SURGICAL PATHOLOGY

## 2020-07-09 NOTE — Anesthesia Postprocedure Evaluation (Signed)
Anesthesia Post Note  Patient: Julie Mckay  Procedure(s) Performed: LAPAROSCOPIC BILATERAL SALPINGECTOMY Procedure #1 (Bilateral ) REMOVAL OF NEXPLANON Procedure #2 (Left Arm Upper)  Patient location during evaluation: Phase II Anesthesia Type: General Level of consciousness: awake Pain management: pain level controlled Vital Signs Assessment: post-procedure vital signs reviewed and stable Respiratory status: spontaneous breathing and respiratory function stable Cardiovascular status: blood pressure returned to baseline and stable Postop Assessment: no headache and no apparent nausea or vomiting Anesthetic complications: no Comments: Late entry   No complications documented.   Last Vitals:  Vitals:   07/08/20 1230 07/08/20 1234  BP: 101/72 94/65  Pulse: 75 76  Resp: (!) 24 16  Temp:  36.7 C  SpO2: 100% 99%    Last Pain:  Vitals:   07/08/20 1234  TempSrc: Oral  PainSc: [place]

## 2020-07-14 ENCOUNTER — Telehealth: Payer: Self-pay | Admitting: Obstetrics & Gynecology

## 2020-07-14 ENCOUNTER — Other Ambulatory Visit: Payer: Self-pay

## 2020-07-14 NOTE — Telephone Encounter (Signed)
Patient called stating that she went to work and is in some pain, patient states that she ran out of her pain medication and needs a refill. Please contact pt

## 2020-07-15 MED ORDER — HYDROCODONE-ACETAMINOPHEN 5-325 MG PO TABS: 1.0000 | ORAL_TABLET | Freq: Four times a day (QID) | ORAL | 0 refills | Status: DC | PRN

## 2020-07-17 ENCOUNTER — Ambulatory Visit (INDEPENDENT_AMBULATORY_CARE_PROVIDER_SITE_OTHER): Payer: BC Managed Care – PPO | Admitting: Obstetrics & Gynecology

## 2020-07-17 ENCOUNTER — Other Ambulatory Visit: Payer: Self-pay

## 2020-07-17 ENCOUNTER — Encounter: Payer: Self-pay | Admitting: Obstetrics & Gynecology

## 2020-07-17 VITALS — BP 107/71 | HR 77 | Ht 62.0 in | Wt 162.0 lb

## 2020-07-17 DIAGNOSIS — Z9889 Other specified postprocedural states: Secondary | ICD-10-CM

## 2020-07-17 DIAGNOSIS — Z029 Encounter for administrative examinations, unspecified: Secondary | ICD-10-CM

## 2020-07-17 NOTE — Progress Notes (Signed)
  HPI: Patient returns for routine postoperative follow-up having undergone laparoscopic bilateral salpingectomy  on 07/08/20.  The patient's immediate postoperative recovery has been unremarkable. Since hospital discharge the patient reports no problems, lower muscle pain.   Current Outpatient Medications: HYDROcodone-acetaminophen (NORCO/VICODIN) 5-325 MG tablet, Take 1 tablet by mouth every 6 (six) hours as needed., Disp: 10 tablet, Rfl: 0 ibuprofen (ADVIL) 200 MG tablet, Take 800 mg by mouth every 8 (eight) hours as needed for moderate pain., Disp: , Rfl:  ketorolac (TORADOL) 10 MG tablet, Take 1 tablet (10 mg total) by mouth every 8 (eight) hours as needed., Disp: 15 tablet, Rfl: 0 naproxen sodium (ALEVE) 220 MG tablet, Take 220 mg by mouth 2 (two) times daily as needed (pain)., Disp: , Rfl:  ondansetron (ZOFRAN ODT) 8 MG disintegrating tablet, Take 1 tablet (8 mg total) by mouth every 8 (eight) hours as needed for nausea or vomiting., Disp: 8 tablet, Rfl: 0  No current facility-administered medications for this visit.    Blood pressure 107/71, pulse 77, height 5\' 2"  (1.575 m), weight 162 lb (73.5 kg), last menstrual period 06/20/2020.  Physical Exam: Incisions x 3 clean dry intact Abdomen small area bruising mid lower abdomen normal benign  Diagnostic Tests:   Pathology: benign  Impression: 1. S/P laparoscopic bilateral salpingectomy 07/08/20      Plan: No orders of the defined types were placed in this encounter.    Follow up: Return in about 3 years (around 07/18/2023), or if symptoms worsen or fail to improve, for yearly.   Florian Buff, MD

## 2020-12-10 ENCOUNTER — Ambulatory Visit: Payer: BC Managed Care – PPO | Admitting: Obstetrics & Gynecology

## 2020-12-23 ENCOUNTER — Other Ambulatory Visit: Payer: Self-pay

## 2020-12-23 ENCOUNTER — Encounter: Payer: Self-pay | Admitting: Obstetrics & Gynecology

## 2020-12-23 ENCOUNTER — Ambulatory Visit (INDEPENDENT_AMBULATORY_CARE_PROVIDER_SITE_OTHER): Payer: BC Managed Care – PPO | Admitting: Obstetrics & Gynecology

## 2020-12-23 VITALS — BP 99/77 | HR 93 | Ht 60.0 in | Wt 162.0 lb

## 2020-12-23 DIAGNOSIS — N941 Unspecified dyspareunia: Secondary | ICD-10-CM

## 2020-12-23 DIAGNOSIS — N946 Dysmenorrhea, unspecified: Secondary | ICD-10-CM | POA: Diagnosis not present

## 2020-12-23 MED ORDER — MEGESTROL ACETATE 40 MG PO TABS: ORAL_TABLET | ORAL | 3 refills | Status: DC

## 2020-12-23 NOTE — Progress Notes (Signed)
Chief Complaint  Patient presents with   Dysmenorrhea   Dyspareunia      35 y.o. [id number] Patient's last menstrual period was 12/18/2020 (exact date). The current method of family planning is tubal ligation.  Outpatient Encounter Medications as of 12/23/2020  Medication Sig   ibuprofen (ADVIL) 200 MG tablet Take 800 mg by mouth every 8 (eight) hours as needed for moderate pain.   megestrol (MEGACE) 40 MG tablet 1 tablet daily   naproxen sodium (ALEVE) 220 MG tablet Take 220 mg by mouth 2 (two) times daily as needed (pain).   [DISCONTINUED] HYDROcodone-acetaminophen (NORCO/VICODIN) 5-325 MG tablet Take 1 tablet by mouth every 6 (six) hours as needed.   [DISCONTINUED] ketorolac (TORADOL) 10 MG tablet Take 1 tablet (10 mg total) by mouth every 8 (eight) hours as needed.   [DISCONTINUED] ondansetron (ZOFRAN ODT) 8 MG disintegrating tablet Take 1 tablet (8 mg total) by mouth every 8 (eight) hours as needed for nausea or vomiting.   No facility-administered encounter medications on file as of 12/23/2020.    Subjective Pt has been having increasing pain with intercourse, deep thrusting, 100% Interfering with her marriage Husband is present Not insertional Also menstrual associated pain is much worse since off hormonal management Bleeding is about the same  Past Medical History:  Diagnosis Date   Bilateral ovarian cysts     Past Surgical History:  Procedure Laterality Date   LAPAROSCOPIC BILATERAL SALPINGECTOMY Bilateral 07/08/2020   Procedure: LAPAROSCOPIC BILATERAL SALPINGECTOMY Procedure #1;  Surgeon: Florian Buff, MD;  Location: [facility];  Service: Gynecology;  Laterality: Bilateral;   NO PAST SURGERIES     NORPLANT REMOVAL Left 07/08/2020   Procedure: REMOVAL OF NEXPLANON Procedure #2;  Surgeon: Florian Buff, MD;  Location: [facility];  Service: Gynecology;  Laterality: Left;    OB History     Gravida  2   Para  2   Term  1   Preterm  1   AB      Living  2       SAB      IAB      Ectopic      Multiple      Live Births  2           No Known Allergies  Social History   Socioeconomic History   Marital status: Soil scientist    Spouse name: Not on file   Number of children: Not on file   Years of education: Not on file   Highest education level: Not on file  Occupational History   Not on file  Tobacco Use   Smoking status: Never   Smokeless tobacco: Never  Vaping Use   Vaping Use: Some days  Substance and Sexual Activity   Alcohol use: Yes    Comment: sometimes   Drug use: Never   Sexual activity: Not Currently    Birth control/protection: Surgical    Comment: Tubal  Other Topics Concern   Not on file  Social History Narrative   Not on file   Social Determinants of Health   Financial Resource Strain: Not on file  Food Insecurity: No Food Insecurity   Worried About Running Out of Food in the Last Year: Never true   Ran Out of Food in the Last Year: Never true  Transportation Needs: No Transportation Needs   Lack of Transportation (Medical): No   Lack of Transportation (Non-Medical): No  Physical Activity: Inactive  Days of Exercise per Week: 0 days   Minutes of Exercise per Session: 0 min  Stress: No Stress Concern Present   Feeling of Stress : Not at all  Social Connections: Moderately Isolated   Frequency of Communication with Friends and Family: More than three times a week   Frequency of Social Gatherings with Friends and Family: Once a week   Attends Religious Services: Never   Marine scientist or Organizations: No   Attends Music therapist: Never   Marital Status: Living with partner    Family History  Problem Relation Age of Onset   Diabetes Maternal Grandmother    Hypertension Father     Medications:       Current Outpatient Medications:    ibuprofen (ADVIL) 200 MG tablet, Take 800 mg by mouth every 8 (eight) hours as needed for moderate pain., Disp: , Rfl:     megestrol (MEGACE) 40 MG tablet, 1 tablet daily, Disp: 30 tablet, Rfl: 3   naproxen sodium (ALEVE) 220 MG tablet, Take 220 mg by mouth 2 (two) times daily as needed (pain)., Disp: , Rfl:   Objective Blood pressure 99/77, pulse 93, height 5' (1.524 m), weight 162 lb (73.5 kg), last menstrual period 12/18/2020.  General WDWN female NAD Vulva:  normal appearing vulva with no masses, tenderness or lesions Vagina:  normal mucosa, no discharge Cervix:  +pain with anterior displacement of the cervix consistent with bump dyspareunia Uterus:  some descent Grade 1normal size, contour, position, consistency, mobility, non-tender Adnexa: ovaries:present,  normal adnexa in size, nontender and no masses   Pertinent ROS No burning with urination, frequency or urgency No nausea, vomiting or diarrhea Nor fever chills or other constitutional symptoms   Labs or studies N/a    Impression Diagnoses this Encounter::   ICD-10-CM   1. Dysmenorrhea  N94.6     2. Dyspareunia, female, bump  N94.10       Established relevant diagnosis(es):   Plan/Recommendations: Meds ordered this encounter  Medications   megestrol (MEGACE) 40 MG tablet    Sig: 1 tablet daily    Dispense:  30 tablet    Refill:  3    Labs or Scans Ordered: No orders of the defined types were placed in this encounter.   Management:: Megestrol suppression for evaluation of repsonse  Follow up Return in about 2 months (around 02/22/2021) for Follow up, with Dr Elonda Husky.      All questions were answered.

## 2021-02-22 ENCOUNTER — Ambulatory Visit (INDEPENDENT_AMBULATORY_CARE_PROVIDER_SITE_OTHER): Payer: BC Managed Care – PPO | Admitting: Obstetrics & Gynecology

## 2021-02-22 ENCOUNTER — Encounter: Payer: Self-pay | Admitting: Obstetrics & Gynecology

## 2021-02-22 ENCOUNTER — Other Ambulatory Visit: Payer: Self-pay

## 2021-02-22 VITALS — BP 123/87 | HR 96 | Ht 60.0 in | Wt 165.0 lb

## 2021-02-22 DIAGNOSIS — N946 Dysmenorrhea, unspecified: Secondary | ICD-10-CM | POA: Diagnosis not present

## 2021-02-22 DIAGNOSIS — N941 Unspecified dyspareunia: Secondary | ICD-10-CM | POA: Diagnosis not present

## 2021-02-22 MED ORDER — MEGESTROL ACETATE 40 MG PO TABS: ORAL_TABLET | ORAL | 3 refills | Status: DC

## 2021-02-22 NOTE — Progress Notes (Signed)
Follow up appointment for results  Chief Complaint  Patient presents with   Follow-up    Medication    Blood pressure 123/87, pulse 96, height 5' (1.524 m), weight 165 lb (74.8 kg).    No improvement on megestrol Continues with 100% deep dyspareunia, not insertional +dysmenorrhea despite megestrol, makes her feel "hormaonal"  MEDS ordered this encounter: Meds ordered this encounter  Medications   megestrol (MEGACE) 40 MG tablet    Sig: 1 tablet daily    Dispense:  30 tablet    Refill:  3    Orders for this encounter: Orders Placed This Encounter  Procedures   US PELVIS (TRANSABDOMINAL ONLY)   US PELVIS TRANSVAGINAL NON-OB (TV ONLY)    Impression: 1. Dysmenorrhea  - US PELVIS (TRANSABDOMINAL ONLY); Future - US PELVIS TRANSVAGINAL NON-OB (TV ONLY); Future  2. Dyspareunia, female  - US PELVIS (TRANSABDOMINAL ONLY); Future - US PELVIS TRANSVAGINAL NON-OB (TV ONLY); Future   Plan:   Follow Up: Return in about 2 weeks (around 03/08/2021) for GYN sono, Follow up, with Dr Elonda Husky.       All questions were answered.  Past Medical History:  Diagnosis Date   Bilateral ovarian cysts     Past Surgical History:  Procedure Laterality Date   LAPAROSCOPIC BILATERAL SALPINGECTOMY Bilateral 07/08/2020   Procedure: LAPAROSCOPIC BILATERAL SALPINGECTOMY Procedure #1;  Surgeon: Florian Buff, MD;  Location: [facility];  Service: Gynecology;  Laterality: Bilateral;   NO PAST SURGERIES     NORPLANT REMOVAL Left 07/08/2020   Procedure: REMOVAL OF NEXPLANON Procedure #2;  Surgeon: Florian Buff, MD;  Location: [facility];  Service: Gynecology;  Laterality: Left;    OB History     Gravida  2   Para  2   Term  1   Preterm  1   AB      Living  2      SAB      IAB      Ectopic      Multiple      Live Births  2           No Known Allergies  Social History   Socioeconomic History   Marital status: Soil scientist    Spouse name: Not on file   Number  of children: Not on file   Years of education: Not on file   Highest education level: Not on file  Occupational History   Not on file  Tobacco Use   Smoking status: Never   Smokeless tobacco: Never  Vaping Use   Vaping Use: Some days  Substance and Sexual Activity   Alcohol use: Yes    Comment: sometimes   Drug use: Never   Sexual activity: Not Currently    Birth control/protection: Surgical    Comment: Tubal  Other Topics Concern   Not on file  Social History Narrative   Not on file   Social Determinants of Health   Financial Resource Strain: Not on file  Food Insecurity: No Food Insecurity   Worried About Running Out of Food in the Last Year: Never true   Ran Out of Food in the Last Year: Never true  Transportation Needs: No Transportation Needs   Lack of Transportation (Medical): No   Lack of Transportation (Non-Medical): No  Physical Activity: Inactive   Days of Exercise per Week: 0 days   Minutes of Exercise per Session: 0 min  Stress: No Stress Concern Present   Feeling of Stress :  Not at all  Social Connections: Moderately Isolated   Frequency of Communication with Friends and Family: More than three times a week   Frequency of Social Gatherings with Friends and Family: Once a week   Attends Religious Services: Never   Marine scientist or Organizations: No   Attends Archivist Meetings: Never   Marital Status: Living with partner    Family History  Problem Relation Age of Onset   Diabetes Maternal Grandmother    Hypertension Father

## 2021-03-25 ENCOUNTER — Ambulatory Visit (INDEPENDENT_AMBULATORY_CARE_PROVIDER_SITE_OTHER): Payer: BC Managed Care – PPO

## 2021-03-25 ENCOUNTER — Ambulatory Visit (INDEPENDENT_AMBULATORY_CARE_PROVIDER_SITE_OTHER): Payer: BC Managed Care – PPO | Admitting: Obstetrics & Gynecology

## 2021-03-25 ENCOUNTER — Encounter: Payer: Self-pay | Admitting: Obstetrics & Gynecology

## 2021-03-25 ENCOUNTER — Other Ambulatory Visit: Payer: Self-pay

## 2021-03-25 VITALS — BP 117/81 | HR 80 | Ht 60.0 in | Wt 163.0 lb

## 2021-03-25 DIAGNOSIS — N941 Unspecified dyspareunia: Secondary | ICD-10-CM | POA: Diagnosis not present

## 2021-03-25 DIAGNOSIS — N946 Dysmenorrhea, unspecified: Secondary | ICD-10-CM | POA: Diagnosis not present

## 2021-03-25 NOTE — Progress Notes (Signed)
Follow up appointment for results  Chief Complaint  Patient presents with   Follow-up    [place] today    Blood pressure 117/81, pulse 80, height 5' (1.524 m), weight 163 lb (73.9 kg).  US PELVIS TRANSVAGINAL NON-OB (TV ONLY)  Result Date: 03/25/2021 Images from the original result were not included.  ..an [employer] Diplomatic Services operational officer) accredited practice Center for [facility] @ [place] [address] Ordering Provider: Florian Buff, MD                                                                                            GYNECOLOGIC SONOGRAM Julie Mckay is a 36 y.o. [id number] LMP 12/18/2021 She is here for a pelvic sonogram for dysmenorrhea/dyspareunia. Uterus                      6.6 x 4.8 x 6.2 cm, Total uterine volume 103 cc, homogeneous anteverted uterus,WNL Endometrium          8.9 mm, symmetrical, WNL Right ovary             2.9 x 1.8 x 2.6 cm, normal Left ovary                2.1 x 1.4 x 2.2 cm, normal No free fluid Technician Comments: PELVIC US TA/TV: homogeneous anteverted uterus,WNL,normal ovaries,ovaries appear mobile,EEC 8.9 mm,no free fluid,bilateral adnexal pain during ultrasound Chaperone [phone] Heide Guile 03/25/2021 9:04 AM Clinical Impression and recommendations: I have reviewed the sonogram results above, combined with the patient's current clinical course, below are my impressions and any appropriate recommendations for management based on the sonographic findings. Uterus normal size shape and contour Endometrium is normal Ovaries: both are normal size shape and morpholgy Normal pelvic sonogram Julie Mckay 03/25/2021 9:38 AM  US PELVIS (TRANSABDOMINAL ONLY)  Result Date: 03/25/2021 Images from the original result were not included.  ..an [employer] Diplomatic Services operational officer) accredited practice Center for [facility] @ [place] [address] Ordering Provider:  Florian Buff, MD                                                                                            GYNECOLOGIC SONOGRAM Julie Mckay is a 36 y.o. [id number] LMP 12/18/2021 She is here for a pelvic sonogram for dysmenorrhea/dyspareunia. Uterus                      6.6 x 4.8 x 6.2 cm, Total uterine volume 103 cc, homogeneous anteverted uterus,WNL Endometrium          8.9 mm, symmetrical, WNL Right ovary  2.9 x 1.8 x 2.6 cm, normal Left ovary                2.1 x 1.4 x 2.2 cm, normal No free fluid Technician Comments: PELVIC US TA/TV: homogeneous anteverted uterus,WNL,normal ovaries,ovaries appear mobile,EEC 8.9 mm,no free fluid,bilateral adnexal pain during ultrasound Chaperone [phone] Heide Guile 03/25/2021 9:04 AM Clinical Impression and recommendations: I have reviewed the sonogram results above, combined with the patient's current clinical course, below are my impressions and any appropriate recommendations for management based on the sonographic findings. Uterus normal size shape and contour Endometrium is normal Ovaries: both are normal size shape and morpholgy Normal pelvic sonogram Julie Mckay 03/25/2021 9:38 AM   Patient is a normal pelvic ultrasound today She has been on megestrol therapy since November 16 with of course improvement in dysmenorrhea because she has been relatively amenorrheic since that time  however her pelvic pain and dyspareunia has continued  On exam she has discomfort with any manipulation of her cervix especially anteriorly which I believe represents the source of her pain with intercourse She does not have any sidewall trigger points The levator ani is normal and is nontender This is not vaginismus Although she could secondarily develop vaginismus with ongoing bump dyspareunia I did a laparoscopy for salpingectomy last June which was normal without any endometriosis or adhesions or any difficulties at that time  As result with nonresponse to  progesterone only, a normal sonogram and her ongoing symptoms I recommended a vaginal hysterectomy which should alleviate her bump dyspareunia and of course permanently alleviate dysmenorrhea  That is scheduled for March 8 at [place] questions were answered  MEDS ordered this encounter: No orders of the defined types were placed in this encounter.   Orders for this encounter: No orders of the defined types were placed in this encounter.   Impression:   ICD-10-CM   1. Dysmenorrhea  N94.6     2. Dyspareunia, female  N94.10        Plan: [facility] 04/14/21 [facility], will be outpatient, discharged from PACU  Follow Up: Return in about 29 days (around 04/23/2021) for [place], with Dr Elonda Husky.     All questions were answered.  Past Medical History:  Diagnosis Date   Bilateral ovarian cysts     Past Surgical History:  Procedure Laterality Date   LAPAROSCOPIC BILATERAL SALPINGECTOMY Bilateral 07/08/2020   Procedure: LAPAROSCOPIC BILATERAL SALPINGECTOMY Procedure #1;  Surgeon: Julie Buff, MD;  Location: [facility];  Service: Gynecology;  Laterality: Bilateral;   NO PAST SURGERIES     NORPLANT REMOVAL Left 07/08/2020   Procedure: REMOVAL OF NEXPLANON Procedure #2;  Surgeon: Julie Buff, MD;  Location: [facility];  Service: Gynecology;  Laterality: Left;    OB History     Gravida  2   Para  2   Term  1   Preterm  1   AB      Living  2      SAB      IAB      Ectopic      Multiple      Live Births  2           No Known Allergies  Social History   Socioeconomic History   Marital status: Soil scientist    Spouse name: Not on file   Number of children: Not on file   Years of education: Not on file   Highest education level: Not on  file  Occupational History   Not on file  Tobacco Use   Smoking status: Never   Smokeless tobacco: Never  Vaping Use   Vaping Use: Some days  Substance and Sexual Activity   Alcohol use: Yes    Comment: sometimes   Drug  use: Never   Sexual activity: Not Currently    Birth control/protection: Surgical    Comment: Tubal  Other Topics Concern   Not on file  Social History Narrative   Not on file   Social Determinants of Health   Financial Resource Strain: Not on file  Food Insecurity: No Food Insecurity   Worried About Running Out of Food in the Last Year: Never true   Ran Out of Food in the Last Year: Never true  Transportation Needs: No Transportation Needs   Lack of Transportation (Medical): No   Lack of Transportation (Non-Medical): No  Physical Activity: Inactive   Days of Exercise per Week: 0 days   Minutes of Exercise per Session: 0 min  Stress: No Stress Concern Present   Feeling of Stress : Not at all  Social Connections: Moderately Isolated   Frequency of Communication with Friends and Family: More than three times a week   Frequency of Social Gatherings with Friends and Family: Once a week   Attends Religious Services: Never   Marine scientist or Organizations: No   Attends Archivist Meetings: Never   Marital Status: Living with partner    Family History  Problem Relation Age of Onset   Diabetes Maternal Grandmother    Hypertension Father

## 2021-03-25 NOTE — Progress Notes (Signed)
PELVIC US TA/TV: homogeneous anteverted uterus,WNL,normal ovaries,ovaries appear mobile,EEC 8.9 mm,no free fluid,bilateral adnexal pain during ultrasound

## 2021-04-01 IMAGING — MG MM DIGITAL DIAGNOSTIC BILAT W/ TOMO W/ CAD
7 of 12 series · 8 of 36 positions shown · non-contrast
Comparison: None
COMPARISON: None

Addendum:
CLINICAL DATA: Patient presents for evaluation of bilateral brown
colored nipple discharge, spontaneous for the last 2 weeks.

EXAM:
DIGITAL DIAGNOSTIC BILATERAL MAMMOGRAM WITH CAD AND TOMO
ULTRASOUND BILATERAL BREAST

[L CC synth-2D]
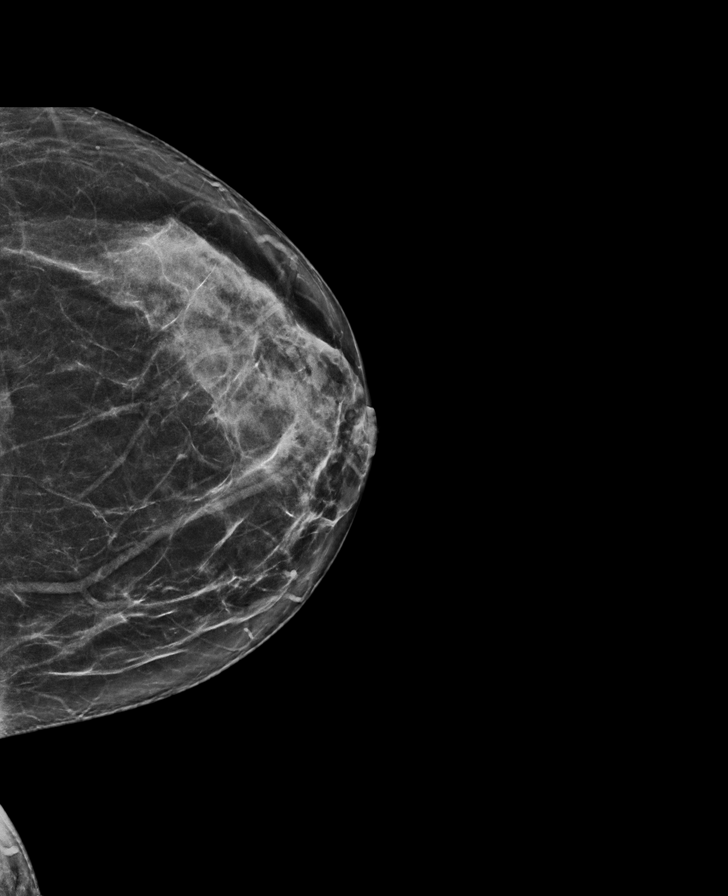

[R CC synth-2D]
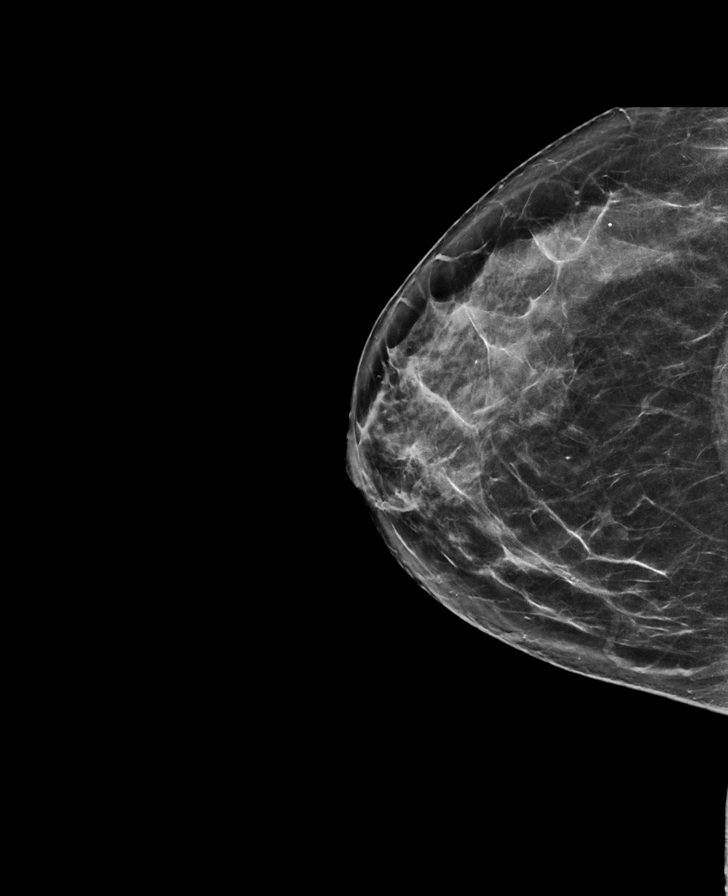

[R MLO synth-2D]
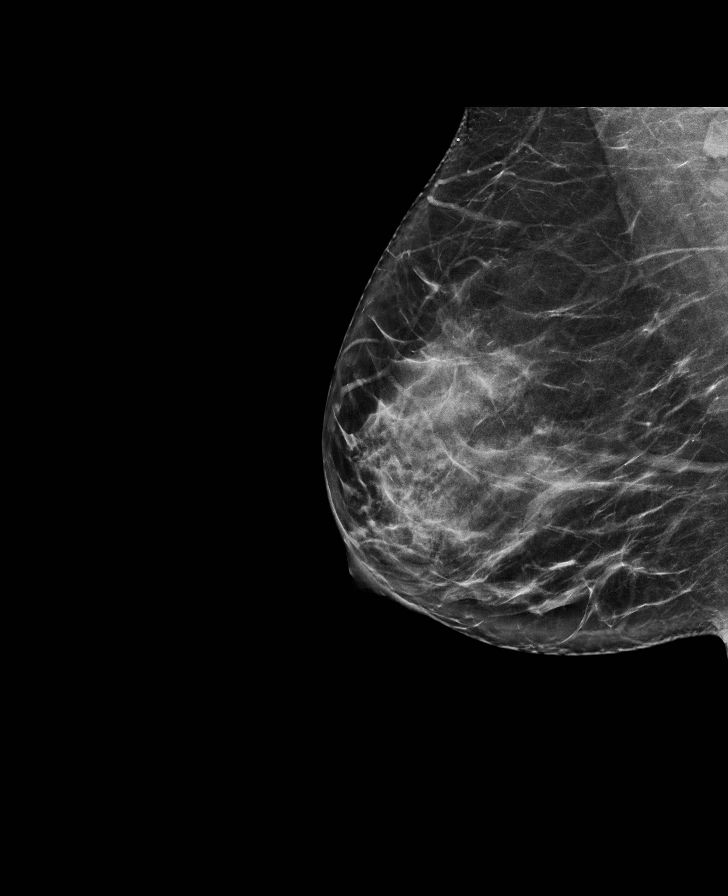

[R TAN synth-2D]
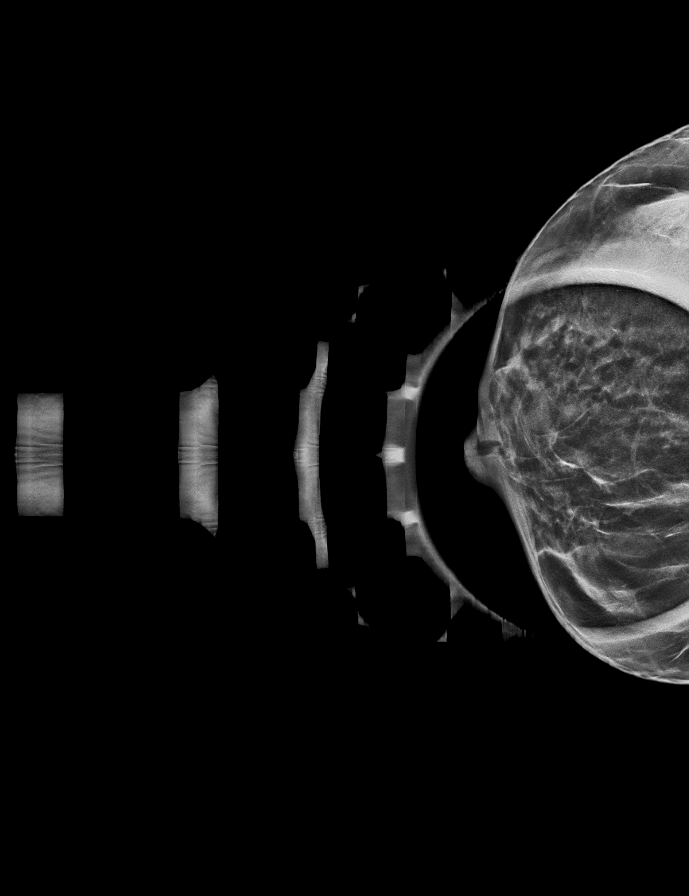

[L TAN synth-2D]
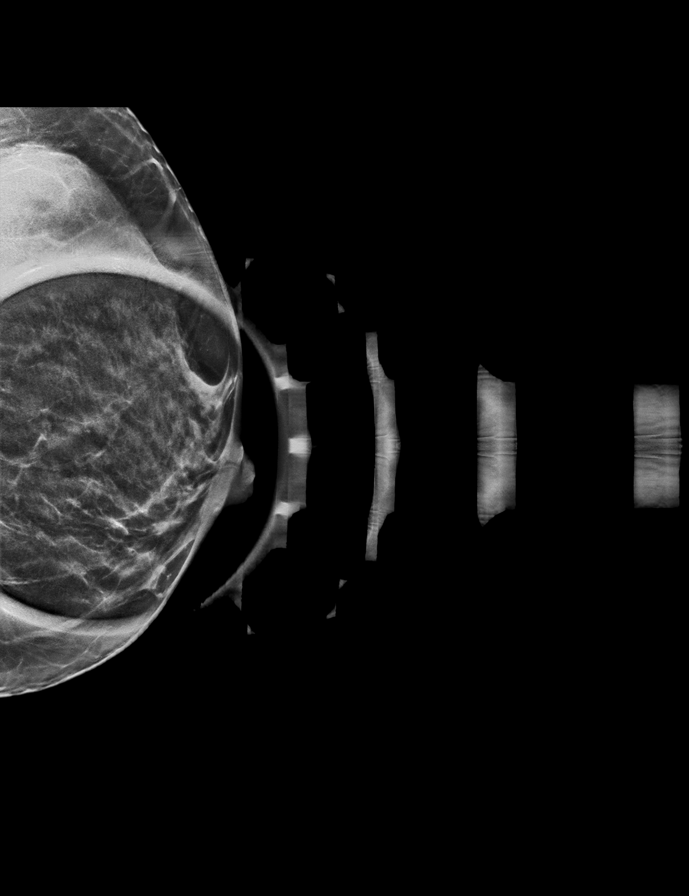

[L MLO synth-2D]
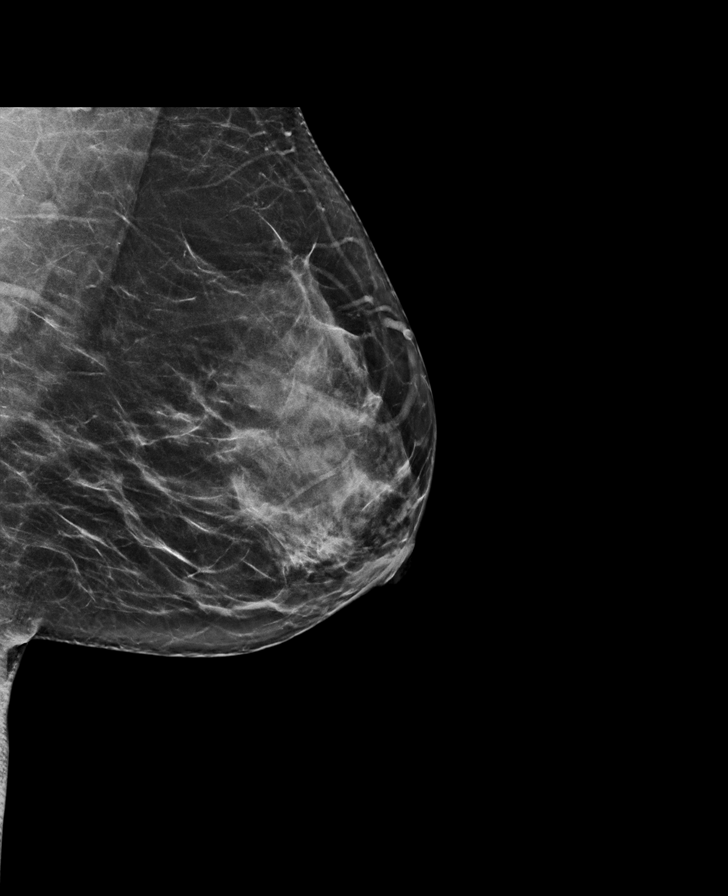

[R TAN tomo · 2 of 47 frames shown]
[frame 16/47]
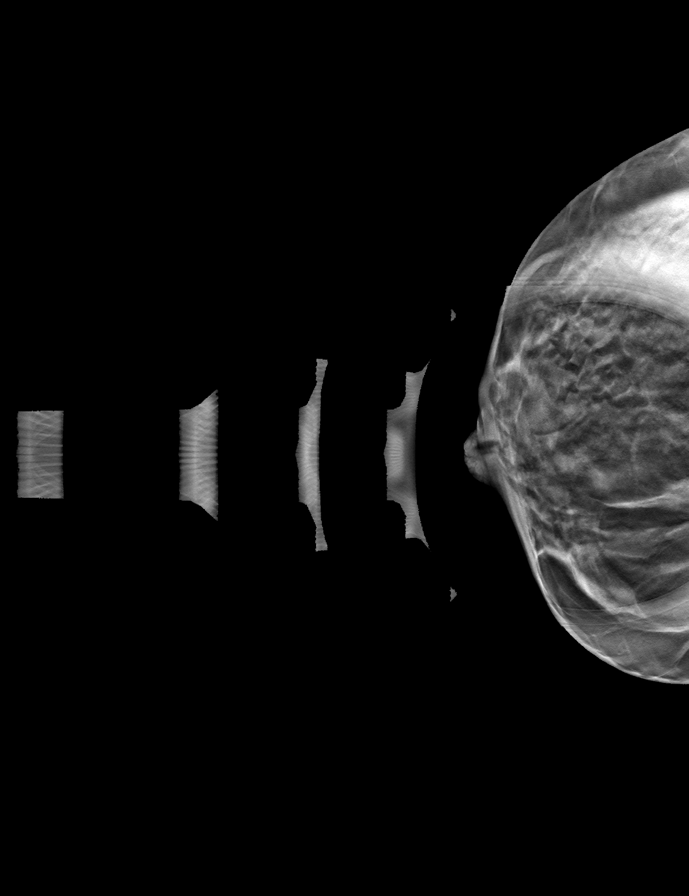
[frame 24/47]
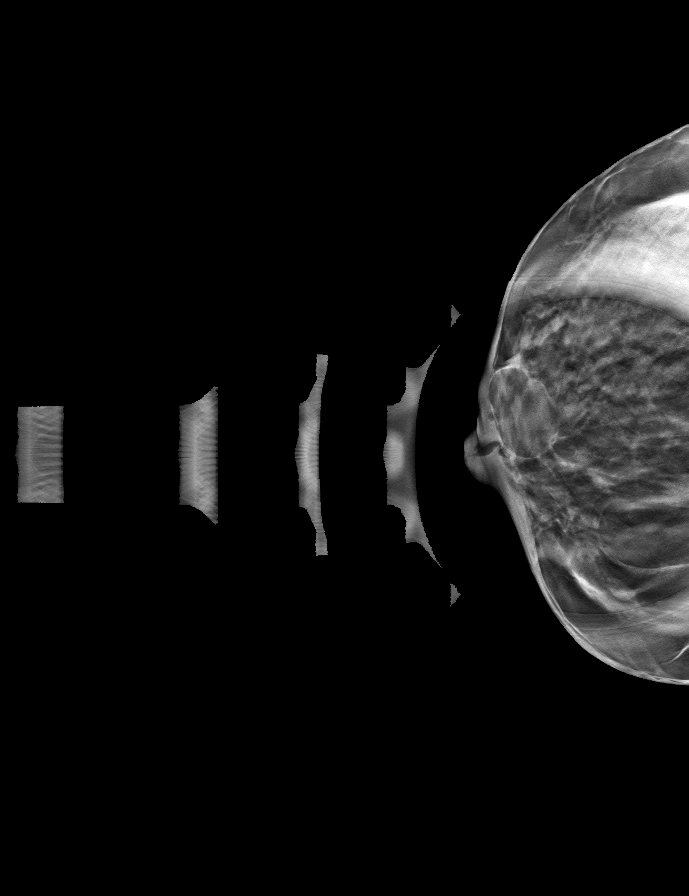

[8 of 36 positions shown; findings below may reference images not displayed]

ACR Breast Density Category c: The breast tissue is heterogeneously
dense, which may obscure small masses.
FINDINGS: No concerning masses, calcifications or distortion identified within
either breast. There is an oval circumscribed low-density mass
within the retroareolar right breast.

Mammographic images were processed with CAD.

Targeted ultrasound is performed, showing dilated ducts with mobile
internal debris within the retroareolar left breast. No definite
intraductal mass is identified.

A 2.1 x 1.3 x 1.9 cm retroareolar cyst 12 o'clock position right
breast. Dilated ducts are demonstrated within the retroareolar right
breast without intraductal mass.
IMPRESSION: No suspicious sonographic or mammographic findings within the
retroareolar right or left breast as causative etiology for history
of bilateral brownish nipple discharge.

RECOMMENDATION:
Surgical consultation and bilateral breast MRI for further
evaluation of nipple discharge.

I have discussed the findings and recommendations with the patient.
Results were also provided in writing at the conclusion of the
visit. If applicable, a reminder letter will be sent to the patient
regarding the next appointment.

BI-RADS CATEGORY  2: Benign.

ADDENDUM:
Surgical consultation, for evaluation of bilateral brown spontaneous
nipple discharge, has been arranged with Dr. Adenyo Ologo at
[[email or web address]] on October 18, 2018.

Waldo Sin, RN on 10/09/2018.

*** End of Addendum ***
ACR Breast Density Category c: The breast tissue is heterogeneously
dense, which may obscure small masses.
FINDINGS: No concerning masses, calcifications or distortion identified within
either breast. There is an oval circumscribed low-density mass
within the retroareolar right breast.

Mammographic images were processed with CAD.

Targeted ultrasound is performed, showing dilated ducts with mobile
internal debris within the retroareolar left breast. No definite
intraductal mass is identified.

A 2.1 x 1.3 x 1.9 cm retroareolar cyst 12 o'clock position right
breast. Dilated ducts are demonstrated within the retroareolar right
breast without intraductal mass.
IMPRESSION: No suspicious sonographic or mammographic findings within the
retroareolar right or left breast as causative etiology for history
of bilateral brownish nipple discharge.

RECOMMENDATION:
Surgical consultation and bilateral breast MRI for further
evaluation of nipple discharge.

I have discussed the findings and recommendations with the patient.
Results were also provided in writing at the conclusion of the
visit. If applicable, a reminder letter will be sent to the patient
regarding the next appointment.

BI-RADS CATEGORY  2: Benign.

## 2021-04-08 NOTE — Patient Instructions (Signed)
Instrucciones Para Antes de la Ciruga   Su ciruga est programada para-(your procedure is scheduled on) 04/14/2021 at 0600.   [place] - (enter)    Por favor llame al [phone] si tiene algn problema en la maana de la ciruga. (please call if you have any problems the morning of surgery.)                  Recuerde: (Remember)   DO NOT eat after midnight 04/13/2021.  You may have clear liquids until 0330 am 04/14/2021.  At 0330 am, 04/14/2021, drink your carb drink. You nay have nothing else to drink after this.    Tome estas medicinas en la maana de la ciruga con un SORBITO de agua (take these meds the morning of surgery with a SIP of water)                            None   Puede cepillarse los dientes en la maana de la [place]. (you may brush your teeth the morning of surgery)   No use joyas, maquillaje de ojos, lpiz labial, crema para el cuerpo o esmalte de uas oscuro. (Do not wear jewelry, eye makeup, lipstick, body lotion, or dark fingernail polish)   No puede usar desodorante. (you may wear deodorant)   Si va a ser ingresado despues de la ciruga, deje la maleta en el carro hasta que se le haya asignado una habitacin. (If you are to be admitted after surgery, leave suitcase in car until your room has been assigned.)   A los pacientes que se les d de alta el mismo da no se les permitir manejar a casa.  (Patients discharged on the day of surgery will not be allowed to drive home)   Use ropa suelta y cmoda de regreso a casa. (wear loose comfortable clothes for ride home)                    Histerectoma vaginal, cuidados posteriores Vaginal Hysterectomy, Care After La siguiente informacin ofrece orientacin sobre cmo cuidarse despus del procedimiento. El mdico tambin podr darle instrucciones ms especficas. Comunquese con el mdico si tiene problemas o preguntas. Qu puedo esperar despus del  procedimiento? Despus del procedimiento, es normal tener los siguientes sntomas: Dolor en la parte baja del abdomen y la vagina. Hemorragia y secrecin vaginal por hasta 1 semana. Deber usar una compresa higinica luego de este procedimiento. Dificultad para defecar (estreimiento). Problemas transitorios para Garment/textile technologist. Cansancio (fatiga). Falta de apetito. Menos inters por el sexo. Sensacin de tristeza u otras emociones. Si se extirparon los ovarios tambin, es comn tener sntomas de menopausia como acaloramiento, sudor nocturno y falta de sueo (insomnio). Siga estas instrucciones en su casa: Medicamentos  Use los medicamentos de venta libre y los recetados solamente como se lo haya indicado el mdico. No tome aspirina ni antiinflamatorios no esteroideos (AINE), como el ibuprofeno. Estos medicamentos pueden provocarle sangrado. Pregntele al mdico si el medicamento recetado: Hace necesario que evite conducir o usar [place]. Puede causarle estreimiento. Es posible que tenga que tomar estas medidas para prevenir o tratar el estreimiento: Electronics engineer suficiente lquido como para Consulting civil engineer Bennie Hind  de color amarillo plido. Usar medicamentos recetados o de Radio broadcast assistant. Consumir alimentos ricos en fibra, como frijoles, cereales integrales, y frutas y verduras frescas. Limitar el consumo de alimentos ricos en grasa y azcares procesados, como los alimentos fritos o dulces. Actividad  Haga reposo como se lo haya indicado el mdico. Retome sus actividades normales segn lo indicado por el mdico. Pregntele al mdico qu actividades son seguras para usted. Evite estar sentada durante largos perodos sin moverse. Levntese y camine un poco cada 1 a 2 horas. Esto es importante para mejorar el flujo sanguneo y la respiracin. Pida ayuda si se siente dbil o inestable. Trate de que alguien la acompae en su casa durante 1 o 2 semanas para ayudarla con los [employer]. No levante  ningn objeto que pese ms de 10 libras (4.5 kg) o que supere el lmite de peso que le hayan indicado, Nurse, children's que el mdico le diga que puede [place]. Si le administraron un sedante durante el procedimiento, puede afectarla por varias horas. No conduzca ni opere maquinaria hasta que el mdico le indique que es seguro [place]. Estilo de vida No consuma ningn producto que contenga nicotina o tabaco. Estos productos incluyen cigarrillos, tabaco para Higher education careers adviser y aparatos de vapeo, como los Psychologist, sport and exercise. Estos pueden retrasar la cicatrizacin despus de la ciruga. Si necesita ayuda para dejar de consumir estos productos, consulte al mdico. No beba alcohol hasta que el mdico lo autorice. Indicaciones generales No se haga duchas vaginales, no utilice tampones ni tenga relaciones sexuales durante al menos 6 semanas o como se lo haya indicado el mdico. Si experimenta cambios fsicos o emocionales despus del procedimiento, hable con su mdico o un terapeuta. Los puntos que le hicieron dentro de la vagina se disolvern con el [place], por lo que no ser necesario retirrselos. No tome baos de inmersin, no nade ni use el jacuzzi hasta que el mdico lo autorice. Es posible que solo pueda tomar duchas durante 2 a 3 semanas. Use medias de compresin como se lo haya indicado su mdico. Estas medias ayudan a evitar la formacin de cogulos de [place] y a reducir la hinchazn de las piernas. Cumpla con todas las visitas de seguimiento. Esto es importante. Comunquese con un mdico si: El medicamento no Production designer, theatre/television/film. Tiene fiebre. Tiene nuseas o vmitos que no desaparecen. Siente mareos. Tiene secrecin de sangre, pus o flujo con mal olor que provienen de la vagina ms de 1 semana despus del procedimiento. Sigue teniendo problemas para Garment/textile technologist de 3 a 5 das despus del procedimiento. Solicite ayuda de inmediato si: Tiene dolor intenso en el abdomen o la espalda. Se desmaya. Tiene sangrado  vaginal abundante y cogulos de sangre que empapan una compresa higinica en menos de 1 hora. Siente falta de aire o Tourist information centre manager. Siente dolor o tiene hinchazn o enrojecimiento en la pierna. Estos sntomas pueden representar un problema grave que constituye Engineer, maintenance (IT). No espere a ver si los sntomas desaparecen. Solicite atencin mdica de inmediato. Comunquese con el servicio de emergencias de su localidad (911 en los [place]). No conduzca por sus propios medios [employer] Financial. Resumen Despus del procedimiento, es normal tener dolor, sangrado vaginal, estreimiento, dificultades temporales para vaciar la vejiga y sentimientos de tristeza u otras emociones. Use los medicamentos de venta libre y los recetados solamente como se lo haya indicado el mdico. Haga reposo como se lo haya indicado el mdico. Retome sus actividades normales segn lo indicado por el mdico. Comunquese  con un mdico si el analgsico no le brinda alivio o si tiene [place], [employer] o dificultad para orinar varios das despus del procedimiento. Obtenga ayuda de inmediato si siente dolor intenso en el abdomen o en la espalda, o si se desmaya, tiene hemorragia abundante, tiene [employer] pecho o le falta el aire. Esta informacin no tiene Marine scientist el consejo del mdico. Asegrese de hacerle al mdico cualquier pregunta que tenga. Document Revised: 11/19/2019 Document Reviewed: 10/15/2019 Elsevier Patient Education  2022 [place] general en adultos, cuidados posteriores General Anesthesia, Adult, Care After Esta hoja le brinda informacin sobre cmo cuidarse despus del procedimiento. El mdico tambin podr darle instrucciones ms especficas. Comunquese con el mdico si tiene problemas o preguntas. Qu puedo esperar despus del procedimiento? Luego del procedimiento, son comunes los siguiente efectos secundarios: Dolor o Scientist, research (life sciences) en el lugar de la va intravenosa  (i.v.). Nuseas. Vmitos. El dolor de Investment banker, operational. Dificultad para concentrarte. Sentir fro o [employer]. Sensacin de debilidad o cansancio. Somnolencia y Programmer, applications. Malestar y Hydrologist. Estos efectos secundarios pueden afectar partes del cuerpo que no estuvieron involucradas en la ciruga. Siga estas instrucciones en su casa: Durante el perodo de [employer] le haya indicado el mdico:  Descanse. No participe en actividades que impliquen posibles cadas o lesiones. No conduzca ni opere maquinaria. No beba alcohol. No tome somnferos ni medicamentos que causen somnolencia. No tome decisiones trascendentes ni firme documentos importantes. No cuide a nios por su cuenta. Comida y bebida Siga las indicaciones del mdico respecto de las restricciones de comidas o bebidas. Cuando [employer], comience a comer cantidades pequeas de alimentos que sean blandos y fciles de Publishing copy (livianos), como una tostada. Retome su dieta habitual de forma gradual. Beber suficiente lquido como para mantener la orina de color amarillo plido. Si vomita, rehidrtese tomando agua, jugo o caldo transparente. Instrucciones generales Si tiene apnea del sueo, la [place] y ciertos medicamentos pueden elevar su riesgo de tener problemas respiratorios. Siga las instrucciones del mdico respecto al uso del dispositivo para dormir: Siempre que duerma, [place] siestas que tome en [employer]. Mientras tome analgsicos recetados, medicamentos para dormir o medicamentos que producen somnolencia. Pida a un adulto responsable que se quede con usted durante el tiempo que se le indique. Es importante tener a alguien que lo ayude a cuidarse hasta que est despierto y Press photographer. Retome sus actividades normales segn lo indicado por el mdico. Pregntele al mdico qu actividades son seguras para usted. Use los medicamentos de venta libre y los recetados solamente como se lo haya indicado el mdico. Si  fuma, no lo haga sin supervisin. Concurra a todas las visitas de seguimiento como se lo haya indicado el mdico. Esto es importante. Comunquese con un mdico si: Tiene nuseas o vmitos que no mejoran con medicamentos. No puede comer ni beber sin vomitar. Su dolor no se alivia con medicamentos. No puede orinar. Tiene una erupcin cutnea. Tiene fiebre. Presenta enrojecimiento alrededor del lugar de la va intravenosa (i.v.) que empeora. Solicite ayuda de inmediato si: Tienes dificultad para respirar. Sientes dolor en el pecho. Observa sangre en la orina o heces, o vomita sangre. Resumen Despus del procedimiento, es comn tener dolor de garganta y nuseas. Tambin es comn sentirse cansado. Pida a un adulto responsable que se quede con usted durante el tiempo que se le indique. Es importante tener a alguien que lo ayude a cuidarse hasta que est despierto y Press photographer. Cuando tenga hambre, comience a comer  cantidades pequeas de alimentos que sean blandos y fciles de digerir (livianos), como una tostada. Retome su dieta habitual de forma gradual. Beber suficiente lquido como para mantener la orina de color amarillo plido. Retome sus actividades normales segn lo indicado por el mdico. Pregntele al mdico qu actividades son seguras para usted. Esta informacin no tiene Marine scientist el consejo del mdico. Asegrese de hacerle al mdico cualquier pregunta que tenga. Document Revised: 07/18/2019 Document Reviewed: 07/18/2019 Elsevier Patient Education  [place].

## 2021-04-11 ENCOUNTER — Other Ambulatory Visit: Payer: Self-pay | Admitting: Obstetrics & Gynecology

## 2021-04-11 DIAGNOSIS — Z01818 Encounter for other preprocedural examination: Secondary | ICD-10-CM

## 2021-04-12 ENCOUNTER — Encounter (HOSPITAL_COMMUNITY)
Admission: RE | Admit: 2021-04-12 | Discharge: 2021-04-12 | Disposition: A | Discharge status: Home / Self Care | Payer: BC Managed Care – PPO | Source: Ambulatory Visit | Attending: Obstetrics & Gynecology | Admitting: Obstetrics & Gynecology

## 2021-04-12 ENCOUNTER — Encounter (HOSPITAL_COMMUNITY): Payer: Self-pay

## 2021-04-12 DIAGNOSIS — D259 Leiomyoma of uterus, unspecified: Secondary | ICD-10-CM | POA: Diagnosis not present

## 2021-04-12 DIAGNOSIS — Z01812 Encounter for preprocedural laboratory examination: Secondary | ICD-10-CM | POA: Insufficient documentation

## 2021-04-12 DIAGNOSIS — N946 Dysmenorrhea, unspecified: Secondary | ICD-10-CM | POA: Diagnosis not present

## 2021-04-12 DIAGNOSIS — N941 Unspecified dyspareunia: Secondary | ICD-10-CM | POA: Diagnosis not present

## 2021-04-12 DIAGNOSIS — Z01818 Encounter for other preprocedural examination: Secondary | ICD-10-CM

## 2021-04-12 LAB — TYPE AND SCREEN
ABO/RH(D): O POS
Antibody Screen: NEGATIVE

## 2021-04-12 LAB — CBC
HCT: 43.8 % (ref 36.0–46.0)
Hemoglobin: 14.3 g/dL (ref 12.0–15.0)
MCH: 30.3 pg (ref 26.0–34.0)
MCHC: 32.6 g/dL (ref 30.0–36.0)
MCV: 92.8 fL (ref 80.0–100.0)
Platelets: 296 10*3/uL (ref 150–400)
RBC: 4.72 MIL/uL (ref 3.87–5.11)
RDW: 13.2 % (ref 11.5–15.5)
WBC: 6.6 10*3/uL (ref 4.0–10.5)
nRBC: 0 % (ref 0.0–0.2)

## 2021-04-12 LAB — URINALYSIS, ROUTINE W REFLEX MICROSCOPIC
Bilirubin Urine: NEGATIVE
Glucose, UA: NEGATIVE mg/dL
Ketones, ur: NEGATIVE mg/dL
Nitrite: NEGATIVE
Protein, ur: NEGATIVE mg/dL
Specific Gravity, Urine: 1.021 (ref 1.005–1.030)
pH: 5 (ref 5.0–8.0)

## 2021-04-12 LAB — COMPREHENSIVE METABOLIC PANEL
ALT: 19 U/L (ref 0–44)
AST: 16 U/L (ref 15–41)
Albumin: 4 g/dL (ref 3.5–5.0)
Alkaline Phosphatase: 129 U/L — ABNORMAL HIGH (ref 38–126)
Anion gap: 7 (ref 5–15)
BUN: 15 mg/dL (ref 6–20)
CO2: 23 mmol/L (ref 22–32)
Calcium: 8.7 mg/dL — ABNORMAL LOW (ref 8.9–10.3)
Chloride: 107 mmol/L (ref 98–111)
Creatinine, Ser: 0.84 mg/dL (ref 0.44–1.00)
GFR, Estimated: >60 mL/min (ref >60)
Glucose, Bld: 86 mg/dL (ref 70–99)
Potassium: 3.4 mmol/L — ABNORMAL LOW (ref 3.5–5.1)
Sodium: 137 mmol/L (ref 135–145)
Total Bilirubin: 0.7 mg/dL (ref 0.3–1.2)
Total Protein: 7.5 g/dL (ref 6.5–8.1)

## 2021-04-12 LAB — RAPID HIV SCREEN (HIV 1/2 AB+AG)
HIV 1/2 Antibodies: NONREACTIVE
HIV-1 P24 Antigen - HIV24: NONREACTIVE

## 2021-04-12 LAB — HCG, QUANTITATIVE, PREGNANCY: hCG, Beta Chain, Quant, S: <1 m[IU]/mL (ref <5)

## 2021-04-14 ENCOUNTER — Other Ambulatory Visit: Payer: Self-pay

## 2021-04-14 ENCOUNTER — Encounter (HOSPITAL_COMMUNITY)
Admission: RE | Disposition: A | Discharge status: Home / Self Care | Payer: Self-pay | Source: Home / Self Care | Attending: Obstetrics & Gynecology

## 2021-04-14 ENCOUNTER — Ambulatory Visit (HOSPITAL_COMMUNITY): Payer: BC Managed Care – PPO | Admitting: Registered Nurse

## 2021-04-14 ENCOUNTER — Ambulatory Visit (HOSPITAL_COMMUNITY)
Admission: RE | Admit: 2021-04-14 | Discharge: 2021-04-14 | Disposition: A | Discharge status: Home / Self Care | Payer: BC Managed Care – PPO | Attending: Obstetrics & Gynecology | Admitting: Obstetrics & Gynecology

## 2021-04-14 ENCOUNTER — Encounter (HOSPITAL_COMMUNITY): Payer: Self-pay | Admitting: Obstetrics & Gynecology

## 2021-04-14 DIAGNOSIS — D259 Leiomyoma of uterus, unspecified: Secondary | ICD-10-CM | POA: Diagnosis not present

## 2021-04-14 DIAGNOSIS — N946 Dysmenorrhea, unspecified: Secondary | ICD-10-CM | POA: Diagnosis not present

## 2021-04-14 DIAGNOSIS — N941 Unspecified dyspareunia: Secondary | ICD-10-CM | POA: Diagnosis not present

## 2021-04-14 DIAGNOSIS — D251 Intramural leiomyoma of uterus: Secondary | ICD-10-CM | POA: Diagnosis not present

## 2021-04-14 DIAGNOSIS — N858 Other specified noninflammatory disorders of uterus: Secondary | ICD-10-CM | POA: Diagnosis not present

## 2021-04-14 HISTORY — PX: VAGINAL HYSTERECTOMY: SHX2639

## 2021-04-14 LAB — ABO/RH: ABO/RH(D): O POS

## 2021-04-14 SURGERY — HYSTERECTOMY, VAGINAL
Anesthesia: General | Site: Vagina

## 2021-04-14 MED ORDER — STERILE WATER FOR IRRIGATION IR SOLN
Status: DC | PRN
  Administered 2021-04-14: 1000 mL

## 2021-04-14 MED ORDER — ONDANSETRON HCL 4 MG/2ML IJ SOLN
INTRAMUSCULAR | Status: DC | PRN
  Administered 2021-04-14: 4 mg via INTRAVENOUS

## 2021-04-14 MED ORDER — OXYCODONE-ACETAMINOPHEN 7.5-325 MG PO TABS: 1.0000 | ORAL_TABLET | ORAL | Status: DC | PRN

## 2021-04-14 MED ORDER — KETOROLAC TROMETHAMINE 10 MG PO TABS: 10.0000 mg | ORAL_TABLET | Freq: Three times a day (TID) | ORAL | 0 refills | Status: DC | PRN

## 2021-04-14 MED ORDER — FENTANYL CITRATE PF 50 MCG/ML IJ SOSY: 50.0000 ug | PREFILLED_SYRINGE | INTRAMUSCULAR | Status: DC | PRN

## 2021-04-14 MED ORDER — PROPOFOL 10 MG/ML IV BOLUS
INTRAVENOUS | Status: AC
Start: 2021-04-14 — End: ?
  Filled 2021-04-14: qty 20

## 2021-04-14 MED ORDER — KETOROLAC TROMETHAMINE 30 MG/ML IJ SOLN
30.0000 mg | Freq: Once | INTRAMUSCULAR | Status: AC
  Administered 2021-04-14: 30 mg via INTRAVENOUS
  Filled 2021-04-14: qty 1

## 2021-04-14 MED ORDER — MIDAZOLAM HCL 2 MG/2ML IJ SOLN
INTRAMUSCULAR | Status: AC
  Filled 2021-04-14: qty 2

## 2021-04-14 MED ORDER — GLYCOPYRROLATE PF 0.2 MG/ML IJ SOSY
PREFILLED_SYRINGE | INTRAMUSCULAR | Status: AC
  Filled 2021-04-14: qty 1

## 2021-04-14 MED ORDER — LACTATED RINGERS IV SOLN: INTRAVENOUS | Status: DC

## 2021-04-14 MED ORDER — CHLORHEXIDINE GLUCONATE 0.12 % MT SOLN: 15.0000 mL | Freq: Once | OROMUCOSAL | Status: AC

## 2021-04-14 MED ORDER — PHENYLEPHRINE 40 MCG/ML (10ML) SYRINGE FOR IV PUSH (FOR BLOOD PRESSURE SUPPORT)
PREFILLED_SYRINGE | INTRAVENOUS | Status: AC
  Filled 2021-04-14: qty 10

## 2021-04-14 MED ORDER — FENTANYL CITRATE (PF) 100 MCG/2ML IJ SOLN
INTRAMUSCULAR | Status: AC
  Filled 2021-04-14: qty 2

## 2021-04-14 MED ORDER — ONDANSETRON HCL 4 MG/2ML IJ SOLN
INTRAMUSCULAR | Status: AC
  Filled 2021-04-14: qty 4

## 2021-04-14 MED ORDER — POVIDONE-IODINE 10 % EX SWAB
2.0000 | Freq: Once | CUTANEOUS | Status: DC
Start: 2021-04-14 — End: 2021-04-14

## 2021-04-14 MED ORDER — CEFAZOLIN SODIUM-DEXTROSE 2-4 GM/100ML-% IV SOLN
2.0000 g | INTRAVENOUS | Status: AC
  Administered 2021-04-14: 2 g via INTRAVENOUS
  Filled 2021-04-14: qty 100

## 2021-04-14 MED ORDER — BUPIVACAINE-EPINEPHRINE (PF) 0.5% -1:200000 IJ SOLN
INTRAMUSCULAR | Status: DC | PRN
  Administered 2021-04-14: 17 mL

## 2021-04-14 MED ORDER — DEXMEDETOMIDINE (PRECEDEX) IN NS 20 MCG/5ML (4 MCG/ML) IV SYRINGE
PREFILLED_SYRINGE | INTRAVENOUS | Status: AC
  Filled 2021-04-14: qty 5

## 2021-04-14 MED ORDER — FENTANYL CITRATE PF 50 MCG/ML IJ SOSY
25.0000 ug | PREFILLED_SYRINGE | INTRAMUSCULAR | Status: DC | PRN
  Administered 2021-04-14: 25 ug via INTRAVENOUS
  Filled 2021-04-14: qty 1

## 2021-04-14 MED ORDER — PHENYLEPHRINE HCL (PRESSORS) 10 MG/ML IV SOLN
INTRAVENOUS | Status: DC | PRN
  Administered 2021-04-14 (×2): 80 ug via INTRAVENOUS

## 2021-04-14 MED ORDER — GLYCOPYRROLATE 0.2 MG/ML IJ SOLN
INTRAMUSCULAR | Status: DC | PRN
Start: 2021-04-14 — End: 2021-04-14
  Administered 2021-04-14: .4 mg via INTRAVENOUS

## 2021-04-14 MED ORDER — 0.9 % SODIUM CHLORIDE (POUR BTL) OPTIME
TOPICAL | Status: DC | PRN
  Administered 2021-04-14: 1000 mL

## 2021-04-14 MED ORDER — NEOSTIGMINE METHYLSULFATE 3 MG/3ML IV SOSY
PREFILLED_SYRINGE | INTRAVENOUS | Status: AC
  Filled 2021-04-14: qty 3

## 2021-04-14 MED ORDER — ORAL CARE MOUTH RINSE: 15.0000 mL | Freq: Once | OROMUCOSAL | Status: AC

## 2021-04-14 MED ORDER — METOCLOPRAMIDE HCL 5 MG/ML IJ SOLN
10.0000 mg | Freq: Once | INTRAMUSCULAR | Status: AC
  Administered 2021-04-14: 10 mg via INTRAVENOUS
  Filled 2021-04-14: qty 2

## 2021-04-14 MED ORDER — ONDANSETRON HCL 4 MG/2ML IJ SOLN: 4.0000 mg | Freq: Once | INTRAMUSCULAR | Status: DC | PRN

## 2021-04-14 MED ORDER — ONDANSETRON HCL 4 MG/2ML IJ SOLN
INTRAMUSCULAR | Status: AC
  Filled 2021-04-14: qty 2

## 2021-04-14 MED ORDER — BUPIVACAINE-EPINEPHRINE (PF) 0.5% -1:200000 IJ SOLN
INTRAMUSCULAR | Status: AC
  Filled 2021-04-14: qty 30

## 2021-04-14 MED ORDER — LIDOCAINE HCL (CARDIAC) PF 100 MG/5ML IV SOSY
PREFILLED_SYRINGE | INTRAVENOUS | Status: DC | PRN
  Administered 2021-04-14: 100 mg via INTRAVENOUS

## 2021-04-14 MED ORDER — SODIUM CHLORIDE 0.9 % IR SOLN
Status: DC | PRN
  Administered 2021-04-14: 3000 mL

## 2021-04-14 MED ORDER — MIDAZOLAM HCL 5 MG/5ML IJ SOLN
INTRAMUSCULAR | Status: DC | PRN
  Administered 2021-04-14: 2 mg via INTRAVENOUS

## 2021-04-14 MED ORDER — SUCCINYLCHOLINE CHLORIDE 200 MG/10ML IV SOSY
PREFILLED_SYRINGE | INTRAVENOUS | Status: DC | PRN
  Administered 2021-04-14: 160 mg via INTRAVENOUS

## 2021-04-14 MED ORDER — CIPROFLOXACIN HCL 500 MG PO TABS: 500.0000 mg | ORAL_TABLET | Freq: Two times a day (BID) | ORAL | 0 refills | Status: DC

## 2021-04-14 MED ORDER — ROCURONIUM BROMIDE 10 MG/ML (PF) SYRINGE
PREFILLED_SYRINGE | INTRAVENOUS | Status: AC
  Filled 2021-04-14: qty 10

## 2021-04-14 MED ORDER — SUCCINYLCHOLINE CHLORIDE 200 MG/10ML IV SOSY
PREFILLED_SYRINGE | INTRAVENOUS | Status: AC
  Filled 2021-04-14: qty 10

## 2021-04-14 MED ORDER — NEOSTIGMINE METHYLSULFATE 5 MG/5ML IV SOSY
PREFILLED_SYRINGE | INTRAVENOUS | Status: DC | PRN
  Administered 2021-04-14: 3 mg via INTRAVENOUS

## 2021-04-14 MED ORDER — DEXAMETHASONE SODIUM PHOSPHATE 10 MG/ML IJ SOLN
INTRAMUSCULAR | Status: AC
  Filled 2021-04-14: qty 1

## 2021-04-14 MED ORDER — ONDANSETRON 8 MG PO TBDP: 8.0000 mg | ORAL_TABLET | Freq: Three times a day (TID) | ORAL | 0 refills | Status: DC | PRN

## 2021-04-14 MED ORDER — DEXMEDETOMIDINE (PRECEDEX) IN NS 20 MCG/5ML (4 MCG/ML) IV SYRINGE
PREFILLED_SYRINGE | INTRAVENOUS | Status: DC | PRN
  Administered 2021-04-14: 8 ug via INTRAVENOUS
  Administered 2021-04-14: 12 ug via INTRAVENOUS

## 2021-04-14 MED ORDER — ATROPINE SULFATE 0.4 MG/ML IV SOLN
INTRAVENOUS | Status: AC
  Filled 2021-04-14: qty 1

## 2021-04-14 MED ORDER — CHLORHEXIDINE GLUCONATE 0.12 % MT SOLN
OROMUCOSAL | Status: AC
  Administered 2021-04-14: 15 mL via OROMUCOSAL
  Filled 2021-04-14: qty 15

## 2021-04-14 MED ORDER — FENTANYL CITRATE (PF) 100 MCG/2ML IJ SOLN
INTRAMUSCULAR | Status: DC | PRN
Start: 2021-04-14 — End: 2021-04-14
  Administered 2021-04-14: 100 ug via INTRAVENOUS
  Administered 2021-04-14 (×2): 25 ug via INTRAVENOUS

## 2021-04-14 MED ORDER — DEXAMETHASONE SODIUM PHOSPHATE 10 MG/ML IJ SOLN
INTRAMUSCULAR | Status: DC | PRN
  Administered 2021-04-14: 10 mg via INTRAVENOUS

## 2021-04-14 MED ORDER — DEXAMETHASONE SODIUM PHOSPHATE 10 MG/ML IJ SOLN
INTRAMUSCULAR | Status: AC
  Filled 2021-04-14: qty 2

## 2021-04-14 MED ORDER — PROPOFOL 10 MG/ML IV BOLUS
INTRAVENOUS | Status: AC
  Filled 2021-04-14: qty 20

## 2021-04-14 MED ORDER — PROPOFOL 10 MG/ML IV BOLUS
INTRAVENOUS | Status: DC | PRN
Start: 2021-04-14 — End: 2021-04-14
  Administered 2021-04-14: 200 mg via INTRAVENOUS

## 2021-04-14 MED ORDER — ROCURONIUM BROMIDE 10 MG/ML (PF) SYRINGE
PREFILLED_SYRINGE | INTRAVENOUS | Status: DC | PRN
  Administered 2021-04-14: 40 mg via INTRAVENOUS

## 2021-04-14 MED ORDER — HYDROMORPHONE HCL 1 MG/ML IJ SOLN: 0.5000 mg | INTRAMUSCULAR | Status: DC | PRN

## 2021-04-14 MED ORDER — EPHEDRINE 5 MG/ML INJ
INTRAVENOUS | Status: AC
  Filled 2021-04-14: qty 5

## 2021-04-14 MED ORDER — OXYCODONE-ACETAMINOPHEN 7.5-325 MG PO TABS: 1.0000 | ORAL_TABLET | Freq: Four times a day (QID) | ORAL | 0 refills | Status: DC | PRN

## 2021-04-14 MED ORDER — KETOROLAC TROMETHAMINE 30 MG/ML IJ SOLN
30.0000 mg | Freq: Once | INTRAMUSCULAR | Status: AC
Start: 2021-04-14 — End: 2021-04-14
  Administered 2021-04-14: 30 mg via INTRAMUSCULAR
  Filled 2021-04-14: qty 1

## 2021-04-14 SURGICAL SUPPLY — 38 items
APPLIER CLIP 13 LRG OPEN (CLIP)
BAG HAMPER (MISCELLANEOUS) ×2 IMPLANT
BLADE SURG SZ10 CARB STEEL (BLADE) IMPLANT
CLIP APPLIE 13 LRG OPEN (CLIP) IMPLANT
CLOTH BEACON ORANGE TIMEOUT ST (SAFETY) ×2 IMPLANT
COVER LIGHT HANDLE STERIS (MISCELLANEOUS) ×4 IMPLANT
DRAPE HALF SHEET 40X57 (DRAPES) ×2 IMPLANT
DRAPE STERI URO 9X17 APER PCH (DRAPES) ×2 IMPLANT
ELECT REM PT RETURN 9FT ADLT (ELECTROSURGICAL) ×2
ELECTRODE REM PT RTRN 9FT ADLT (ELECTROSURGICAL) ×1 IMPLANT
GAUZE 4X4 16PLY ~~LOC~~+RFID DBL (SPONGE) ×3 IMPLANT
GLOVE ECLIPSE 8.0 STRL XLNG CF (GLOVE) ×2 IMPLANT
GLOVE SRG 8 PF TXTR STRL LF DI (GLOVE) ×1 IMPLANT
GLOVE SURG UNDER POLY LF SZ7 (GLOVE) ×6 IMPLANT
GLOVE SURG UNDER POLY LF SZ8 (GLOVE) ×1
GOWN STRL REUS W/TWL LRG LVL3 (GOWN DISPOSABLE) ×4 IMPLANT
GOWN STRL REUS W/TWL XL LVL3 (GOWN DISPOSABLE) ×2 IMPLANT
IV NS IRRIG 3000ML ARTHROMATIC (IV SOLUTION) ×2 IMPLANT
KIT BLADEGUARD II DBL (SET/KITS/TRAYS/PACK) ×2 IMPLANT
KIT TURNOVER CYSTO (KITS) ×2 IMPLANT
MANIFOLD NEPTUNE II (INSTRUMENTS) ×2 IMPLANT
NDL HYPO 21X1.5 SAFETY (NEEDLE) ×1 IMPLANT
NEEDLE HYPO 21X1.5 SAFETY (NEEDLE) ×2 IMPLANT
NS IRRIG 1000ML POUR BTL (IV SOLUTION) ×2 IMPLANT
PACK PERI GYN (CUSTOM PROCEDURE TRAY) ×2 IMPLANT
PAD ARMBOARD 7.5X6 YLW CONV (MISCELLANEOUS) ×2 IMPLANT
SET BASIN LINEN APH (SET/KITS/TRAYS/PACK) ×2 IMPLANT
SUT MNCRL+ AB 3-0 CT1 36 (SUTURE) IMPLANT
SUT MON AB 3-0 SH 27 (SUTURE) IMPLANT
SUT MONOCRYL AB 3-0 CT1 36IN (SUTURE) ×1
SUT VIC AB 0 CT1 27 (SUTURE) ×2
SUT VIC AB 0 CT1 27XCR 8 STRN (SUTURE) ×2 IMPLANT
SYR CONTROL 10ML LL (SYRINGE) ×2 IMPLANT
TRAY FOLEY MTR SLVR 16FR STAT (SET/KITS/TRAYS/PACK) IMPLANT
TRAY FOLEY W/BAG SLVR 16FR (SET/KITS/TRAYS/PACK) ×1
TRAY FOLEY W/BAG SLVR 16FR ST (SET/KITS/TRAYS/PACK) IMPLANT
VERSALIGHT (MISCELLANEOUS) ×2 IMPLANT
WATER STERILE IRR 1000ML POUR (IV SOLUTION) ×2 IMPLANT

## 2021-04-14 NOTE — H&P (Signed)
Preoperative History and Physical  Julie Mckay is a 36 y.o. [id number] with No LMP recorded. admitted for a vaginal hysterectomy  . Patient is a normal pelvic ultrasound  She has been on megestrol therapy since November 16 with of course improvement in dysmenorrhea because she has been relatively amenorrheic since that time  however her pelvic pain and dyspareunia has continued   On exam she has discomfort with any manipulation of her cervix especially anteriorly which I believe represents the source of her pain with intercourse She does not have any sidewall trigger points The levator ani is normal and is nontender This is not vaginismus Although she could secondarily develop vaginismus with ongoing bump dyspareunia I did a laparoscopy for salpingectomy last June which was normal without any endometriosis or adhesions or any difficulties at that time   As result with nonresponse to progesterone only, a normal sonogram and her ongoing symptoms I recommended a vaginal hysterectomy which should alleviate her bump dyspareunia and of course permanently alleviate dysmenorrhea   That is scheduled for March 8 at [place] questions were answered   PMH:    Past Medical History:  Diagnosis Date   Bilateral ovarian cysts     PSH:     Past Surgical History:  Procedure Laterality Date   LAPAROSCOPIC BILATERAL SALPINGECTOMY Bilateral 07/08/2020   Procedure: LAPAROSCOPIC BILATERAL SALPINGECTOMY Procedure #1;  Surgeon: Florian Buff, MD;  Location: [facility];  Service: Gynecology;  Laterality: Bilateral;   NORPLANT REMOVAL Left 07/08/2020   Procedure: REMOVAL OF NEXPLANON Procedure #2;  Surgeon: Florian Buff, MD;  Location: [facility];  Service: Gynecology;  Laterality: Left;    POb/GynH:      OB History     Gravida  2   Para  2   Term  1   Preterm  1   AB      Living  2      SAB      IAB      Ectopic      Multiple      Live Births  2           SH:    Social History   Tobacco Use   Smoking status: Never   Smokeless tobacco: Never  Vaping Use   Vaping Use: Some days  Substance Use Topics   Alcohol use: Yes    Comment: sometimes   Drug use: Never    FH:    Family History  Problem Relation Age of Onset   Diabetes Maternal Grandmother    Hypertension Father      Allergies: No Known Allergies  Medications:       Current Facility-Administered Medications:    ceFAZolin (ANCEF) IVPB 2g/100 mL premix, 2 g, Intravenous, On Call to OR, Florian Buff, MD   chlorhexidine (PERIDEX) 0.12 % solution 15 mL, 15 mL, Mouth/Throat, Once **OR** MEDLINE mouth rinse, 15 mL, Mouth Rinse, Once, Julie Mckay, Julie Keens, MD   chlorhexidine (PERIDEX) 0.12 % solution, , , ,    ketorolac (TORADOL) 30 MG/ML injection 30 mg, 30 mg, Intravenous, Once, Julie Mckay, Julie Clause, MD   lactated ringers infusion, , Intravenous, Continuous, Julie Mckay, Julie Keens, MD, Last Rate: 10 mL/hr at 04/14/21 0721, New Bag at 04/14/21 0721   povidone-iodine 10 % swab 2 application., 2 application., Topical, Once, Florian Buff, MD  Review of Systems:   Review of Systems  Constitutional: Negative for fever, chills, weight loss, malaise/fatigue and diaphoresis.  HENT:  Negative for hearing loss, ear pain, nosebleeds, congestion, sore throat, neck pain, tinnitus and ear discharge.   Eyes: Negative for blurred vision, double vision, photophobia, pain, discharge and redness.  Respiratory: Negative for cough, hemoptysis, sputum production, shortness of breath, wheezing and stridor.   Cardiovascular: Negative for chest pain, palpitations, orthopnea, claudication, leg swelling and PND.  Gastrointestinal: Positive for abdominal pain. Negative for heartburn, nausea, vomiting, diarrhea, constipation, blood in stool and melena.  Genitourinary: Negative for dysuria, urgency, frequency, hematuria and flank pain.  Musculoskeletal: Negative for myalgias, back pain, joint pain and falls.  Skin: Negative for  itching and rash.  Neurological: Negative for dizziness, tingling, tremors, sensory change, speech change, focal weakness, seizures, loss of consciousness, weakness and headaches.  Endo/Heme/Allergies: Negative for environmental allergies and polydipsia. Does not bruise/bleed easily.  Psychiatric/Behavioral: Negative for depression, suicidal ideas, hallucinations, memory loss and substance abuse. The patient is not nervous/anxious and does not have insomnia.      PHYSICAL EXAM:  Blood pressure 105/80, pulse 85, temperature 98.3 F (36.8 C), temperature source Oral, resp. rate 18, SpO2 99 %.    Vitals reviewed. Constitutional: She is oriented to person, place, and time. She appears well-developed and well-nourished.  HENT:  Head: Normocephalic and atraumatic.  Right Ear: External ear normal.  Left Ear: External ear normal.  Nose: Nose normal.  Mouth/Throat: Oropharynx is clear and moist.  Eyes: Conjunctivae and EOM are normal. Pupils are equal, round, and reactive to light. Right eye exhibits no discharge. Left eye exhibits no discharge. No scleral icterus.  Neck: Normal range of motion. Neck supple. No tracheal deviation present. No thyromegaly present.  Cardiovascular: Normal rate, regular rhythm, normal heart sounds and intact distal pulses.  Exam reveals no gallop and no friction rub.   No murmur heard. Respiratory: Effort normal and breath sounds normal. No respiratory distress. She has no wheezes. She has no rales. She exhibits no tenderness.  GI: Soft. Bowel sounds are normal. She exhibits no distension and no mass. There is tenderness. There is no rebound and no guarding.  Genitourinary:       Vulva is normal without lesions Vagina is pink moist without discharge Cervix normal in appearance and pap is normal Uterus is  normal size shape contour tender with cervical bumping consistent with her pain with intercourse Adnexa is negative with normal sized ovaries by sonogram   Musculoskeletal: Normal range of motion. She exhibits no edema and no tenderness.  Neurological: She is alert and oriented to person, place, and time. She has normal reflexes. She displays normal reflexes. No cranial nerve deficit. She exhibits normal muscle tone. Coordination normal.  Skin: Skin is warm and dry. No rash noted. No erythema. No pallor.  Psychiatric: She has a normal mood and affect. Her behavior is normal. Judgment and thought content normal.    Labs: Results for orders placed or performed during the hospital encounter of 04/12/21 (from the past 336 hour(s))  CBC   Collection Time: 04/12/21  9:43 AM  Result Value Ref Range   WBC 6.6 4.0 - 10.5 K/uL   RBC 4.72 3.87 - 5.11 MIL/uL   Hemoglobin 14.3 12.0 - 15.0 g/dL   HCT 43.8 36.0 - 46.0 %   MCV 92.8 80.0 - 100.0 fL   MCH 30.3 26.0 - 34.0 pg   MCHC 32.6 30.0 - 36.0 g/dL   RDW 13.2 11.5 - 15.5 %   Platelets 296 150 - 400 K/uL   nRBC 0.0 0.0 - 0.2 %  Comprehensive metabolic panel   Collection Time: 04/12/21  9:43 AM  Result Value Ref Range   Sodium 137 135 - 145 mmol/L   Potassium 3.4 (L) 3.5 - 5.1 mmol/L   Chloride 107 98 - 111 mmol/L   CO2 23 22 - 32 mmol/L   Glucose, Bld 86 70 - 99 mg/dL   BUN 15 6 - 20 mg/dL   Creatinine, Ser 0.84 0.44 - 1.00 mg/dL   Calcium 8.7 (L) 8.9 - 10.3 mg/dL   Total Protein 7.5 6.5 - 8.1 g/dL   Albumin 4.0 3.5 - 5.0 g/dL   AST 16 15 - 41 U/L   ALT 19 0 - 44 U/L   Alkaline Phosphatase 129 (H) 38 - 126 U/L   Total Bilirubin 0.7 0.3 - 1.2 mg/dL   GFR, Estimated >60 >60 mL/min   Anion gap 7 5 - 15  hCG, quantitative, pregnancy   Collection Time: 04/12/21  9:43 AM  Result Value Ref Range   hCG, Beta Chain, Quant, S <1 <5 mIU/mL  Rapid HIV screen (HIV 1/2 Ab+Ag)   Collection Time: 04/12/21  9:43 AM  Result Value Ref Range   HIV-1 P24 Antigen - HIV24 NON REACTIVE NON REACTIVE   HIV 1/2 Antibodies NON REACTIVE NON REACTIVE   Interpretation (HIV Ag Ab)      A non reactive test result  means that HIV 1 or HIV 2 antibodies and HIV 1 p24 antigen were not detected in the specimen.  Urinalysis, Routine w reflex microscopic Urine, Clean Catch   Collection Time: 04/12/21  9:43 AM  Result Value Ref Range   Color, Urine YELLOW YELLOW   APPearance CLEAR CLEAR   Specific Gravity, Urine 1.021 1.005 - 1.030   pH 5.0 5.0 - 8.0   Glucose, UA NEGATIVE NEGATIVE mg/dL   Hgb urine dipstick SMALL (A) NEGATIVE   Bilirubin Urine NEGATIVE NEGATIVE   Ketones, ur NEGATIVE NEGATIVE mg/dL   Protein, ur NEGATIVE NEGATIVE mg/dL   Nitrite NEGATIVE NEGATIVE   Leukocytes,Ua MODERATE (A) NEGATIVE   RBC / HPF 0-5 0 - 5 RBC/hpf   WBC, UA 0-5 0 - 5 WBC/hpf   Bacteria, UA RARE (A) NONE SEEN   Squamous Epithelial / LPF 6-10 0 - 5   Mucus PRESENT   Type and screen   Collection Time: 04/12/21  9:43 AM  Result Value Ref Range   ABO/RH(D) O POS    Antibody Screen NEG    Sample Expiration      04/26/2021,2359 Performed at [facility], [address], [place], [place] [address]     EKG: No orders found for this or any previous visit.  Imaging Studies: US PELVIS TRANSVAGINAL NON-OB (TV ONLY)  Result Date: 03/25/2021 Images from the original result were not included.  ..an Sales executive of Ultrasound Medicine Diplomatic Services operational officer) accredited practice Center for [facility] @ [address] [place] [address] Ordering Provider: Florian Buff, MD                                                                                            GYNECOLOGIC SONOGRAM Julie Mckay  Julie Mckay is a 36 y.o. [id number] LMP 12/18/2021 She is here for a pelvic sonogram for dysmenorrhea/dyspareunia. Uterus                      6.6 x 4.8 x 6.2 cm, Total uterine volume 103 cc, homogeneous anteverted uterus,WNL Endometrium          8.9 mm, symmetrical, WNL Right ovary             2.9 x 1.8 x 2.6 cm, normal Left ovary                2.1 x 1.4 x 2.2 cm, normal No free fluid Technician Comments: PELVIC US TA/TV:  homogeneous anteverted uterus,WNL,normal ovaries,ovaries appear mobile,EEC 8.9 mm,no free fluid,bilateral adnexal pain during ultrasound Chaperone [address] Heide Guile 03/25/2021 9:04 AM Clinical Impression and recommendations: I have reviewed the sonogram results above, combined with the patient's current clinical course, below are my impressions and any appropriate recommendations for management based on the sonographic findings. Uterus normal size shape and contour Endometrium is normal Ovaries: both are normal size shape and morpholgy Normal pelvic sonogram Florian Buff 03/25/2021 9:38 AM  US PELVIS (TRANSABDOMINAL ONLY)  Result Date: 03/25/2021 Images from the original result were not included.  ..an [employer] of Ultrasound Medicine Diplomatic Services operational officer) accredited practice Center for [facility] @ [place] [address] Ordering Provider: Florian Buff, MD                                                                                            GYNECOLOGIC SONOGRAM Marleen Moret is a 36 y.o. [id number] LMP 12/18/2021 She is here for a pelvic sonogram for dysmenorrhea/dyspareunia. Uterus                      6.6 x 4.8 x 6.2 cm, Total uterine volume 103 cc, homogeneous anteverted uterus,WNL Endometrium          8.9 mm, symmetrical, WNL Right ovary             2.9 x 1.8 x 2.6 cm, normal Left ovary                2.1 x 1.4 x 2.2 cm, normal No free fluid Technician Comments: PELVIC US TA/TV: homogeneous anteverted uterus,WNL,normal ovaries,ovaries appear mobile,EEC 8.9 mm,no free fluid,bilateral adnexal pain during ultrasound Chaperone [address] Heide Guile 03/25/2021 9:04 AM Clinical Impression and recommendations: I have reviewed the sonogram results above, combined with the patient's current clinical course, below are my impressions and any appropriate recommendations for management based on the sonographic findings. Uterus normal size shape and contour Endometrium is normal  Ovaries: both are normal size shape and morpholgy Normal pelvic sonogram Florian Buff 03/25/2021 9:38 AM     Assessment: Dysmenorrhea Dyspareunia Unresponsive to conservative measures  Plan: TVH  Pt understands the risks of surgery including but not limited t  excessive bleeding requiring transfusion or reoperation, post-operative infection requiring prolonged hospitalization or re-hospitalization and antibiotic therapy, and damage to other organs including bladder, bowel, ureters and  major vessels.  The patient also understands the alternative treatment options which were discussed in full.  All questions were answered.  Florian Buff 04/14/2021 7:26 AM   Florian Buff 04/14/2021 7:24 AM

## 2021-04-14 NOTE — Anesthesia Preprocedure Evaluation (Signed)
Anesthesia Evaluation  ?Patient identified by MRN, date of birth, ID band ?Patient awake ? ? ? ?Reviewed: ?Allergy & Precautions, H&P , NPO status , Patient's Chart, lab work & pertinent test results, reviewed documented beta blocker date and time  ? ?Airway ?Mallampati: II ? ?TM Distance: >3 FB ?Neck ROM: full ? ? ? Dental ?no notable dental hx. ? ?  ?Pulmonary ?neg pulmonary ROS, Patient abstained from smoking.,  ?  ?Pulmonary exam normal ?breath sounds clear to auscultation ? ? ? ? ? ? Cardiovascular ?Exercise Tolerance: Good ?negative cardio ROS ? ? ?Rhythm:regular Rate:Normal ? ? ?  ?Neuro/Psych ?negative neurological ROS ? negative psych ROS  ? GI/Hepatic ?negative GI ROS, Neg liver ROS,   ?Endo/Other  ?negative endocrine ROS ? Renal/GU ?negative Renal ROS  ?negative genitourinary ?  ?Musculoskeletal ? ? Abdominal ?  ?Peds ? Hematology ?negative hematology ROS ?(+)   ?Anesthesia Other Findings ? ? Reproductive/Obstetrics ?negative OB ROS ? ?  ? ? ? ? ? ? ? ? ? ? ? ? ? ?  ?  ? ? ? ? ? ? ? ? ?Anesthesia Physical ?Anesthesia Plan ? ?ASA: 2 ? ?Anesthesia Plan: General and General ETT  ? ?Post-op Pain Management:   ? ?Induction:  ? ?PONV Risk Score and Plan: Ondansetron ? ?Airway Management Planned:  ? ?Additional Equipment:  ? ?Intra-op Plan:  ? ?Post-operative Plan:  ? ?Informed Consent: I have reviewed the patients History and Physical, chart, labs and discussed the procedure including the risks, benefits and alternatives for the proposed anesthesia with the patient or authorized representative who has indicated his/her understanding and acceptance.  ? ? ? ?Dental Advisory Given ? ?Plan Discussed with: CRNA ? ?Anesthesia Plan Comments:   ? ? ? ? ? ? ?Anesthesia Quick Evaluation ? ?

## 2021-04-14 NOTE — Anesthesia Procedure Notes (Signed)
Procedure Name: Intubation ?Date/Time: 04/14/2021 8:03 AM ?Performed by: Sharee Pimple, CRNA ?Pre-anesthesia Checklist: Patient identified, Emergency Drugs available, Patient being monitored, Suction available and Timeout performed ?Patient Re-evaluated:Patient Re-evaluated prior to induction ?Oxygen Delivery Method: Circle system utilized ?Preoxygenation: Pre-oxygenation with 100% oxygen ?Induction Type: IV induction ?Ventilation: Mask ventilation without difficulty ?Laryngoscope Size: Mac and 3 ?Grade View: Grade I ?Tube type: Oral ?Tube size: 7.0 mm ?Number of attempts: 1 ?Airway Equipment and Method: Stylet ?Placement Confirmation: ETT inserted through vocal cords under direct vision ?Secured at: 22 cm ?Tube secured with: Tape ?Dental Injury: Teeth and Oropharynx as per pre-operative assessment  ? ? ? ? ?

## 2021-04-14 NOTE — Op Note (Signed)
Preoperative diagnosis:  1.  dysmenorrhea ?                                        2.  Dysparenia, bump, 100% ?                                        3.   ?                                        4.   ? ?Postoperative diagnosis:  Same as above  ? ?Procedure:  Vaginal hysterectomy ? ?Surgeon:  Florian Buff MD ? ?Assistant: DR Nelda Marseille ? ?Anesthesia:  General Endotracheal ? ?Findings:  small fibroids, high posterior peritoneal reflection ? ?Intraoperative findings were high posterior peritoneal reflection, otherwise normal uterus and ovaries noted, small fibroids, nothing significant ? ?Description of operation:  The patient was taken from the preoperative area to the operating room in stable condition.  ?She was placed in the supine position and underwent a GET anesthesia ?Patient was prepped and draped in the usual sterile fashion and a Foley catheter was placed.  ? A weighted speculum was placed and the cervix was grasped with thyroid tenaculums both anteriorly and posteriorly.   ?0.5% Marcaine plain was injected in a circumferential fashion about the cervix and the electrocautery unit was used to incise the vagina and push at all cervix.   ?The posterior cul-de-sac was not able to be entered. ?I could feel the reflection but it was very high  ?The uterosacral ligaments were clamped cut and inspection suture ligated and held.  The cardinal ligaments were then clamped cut transfixion suture ligated and cut.  ?The anterior peritoneum was identified the anterior cul-de-sac was entered sharply without difficulty.  ?The anterior and posterior leaves of the broad ligament were plicated and the uterine vessels were clamped cut and suture ligated.  ?Serial pedicles were taken of the fundus with each pedicle being clamped cut and suture ligated.  ?I morcellated the uterus to effect removal ?The utero-ovarian ligaments were crossclamped the uterus was removed and both pedicles were transfixion suture ligated.  ?There was good  hemostasis of all the pedicles.  ?The anterior posterior vagina was closed in interrupted fashion with good resultant hemostasis. ? Prior to  closure the lower pelvis and vagina were irrigated vigorously.  ? The sponge needle and instrument counts were correct x 3.  ? Total blood loss for the procedure was 125 cc.   ?The patient received 2 g of Ancef and 30 mg of Toradol IV preoperatively prophylactically.  ? She was taken to the recovery room in good stable condition awake alert doing well. ? ?Florian Buff ?04/14/2021, 9:51 AM ? ?

## 2021-04-14 NOTE — Discharge Instructions (Signed)
Post Operative Pain Med Plan: ? ? ? ?>Take the oxycodone 1 tablet, on a schedule, around the clock, every 6 hours(set your phone alarm) for the first 2 days, there may  ?   Be times when you will need 2 tablets but taking them on a schedule will decrease this need ? ?>Then take the oxycodone on an as needed basis per the prescription instructions ? ?>Take the ketorolac or toradol every 8 hours for the first 3 days then the remainder to supplement the oxycodone ? ?>After the toradol is gone then use the ibuprofen as ordered as needed to help supplement the oxycodone ? ?>Use a heating pad as well as needed ? ?>Of course the zofran is available to take as prescribed when needed for nausea ? ?>Be gentle with your diet the first few days, liquids and soft non spicy food, fruits are great ? ?>Get up and move, no lifting or straining ? ?Dr Elonda Husky ? ?

## 2021-04-14 NOTE — Anesthesia Postprocedure Evaluation (Signed)
Anesthesia Post Note ? ?Patient: Azlynn Mitnick Escorcia ? ?Procedure(s) Performed: HYSTERECTOMY VAGINAL (Vagina ) ? ?Patient location during evaluation: Phase II ?Anesthesia Type: General ?Level of consciousness: awake ?Pain management: pain level controlled ?Vital Signs Assessment: post-procedure vital signs reviewed and stable ?Respiratory status: spontaneous breathing and respiratory function stable ?Cardiovascular status: blood pressure returned to baseline and stable ?Postop Assessment: no headache and no apparent nausea or vomiting ?Anesthetic complications: no ?Comments: Late entry ? ? ?No notable events documented. ? ? ?Last Vitals:  ?Vitals:  ? 04/14/21 1050 04/14/21 1117  ?BP: (!) 89/53 (!) 96/57  ?Pulse:  75  ?Resp: 14 17  ?Temp:  36.5 ?C  ?SpO2:  97%  ?  ?Last Pain:  ?Vitals:  ? 04/14/21 1117  ?TempSrc: Oral  ?PainSc:   ? ? ?  ?  ?  ?  ?  ?  ? ?Louann Sjogren ? ? ? ? ?

## 2021-04-14 NOTE — Transfer of Care (Signed)
Immediate Anesthesia Transfer of Care Note ? ?Patient: Julie Mckay ? ?Procedure(s) Performed: HYSTERECTOMY VAGINAL (Vagina ) ? ?Patient Location: PACU ? ?Anesthesia Type:General ? ?Level of Consciousness: awake, alert  and oriented ? ?Airway & Oxygen Therapy: Patient Spontanous Breathing and Patient connected to nasal cannula oxygen ? ?Post-op Assessment: Report given to RN, Post -op Vital signs reviewed and stable and Patient moving all extremities ? ?Post vital signs: Reviewed and stable ? ?Last Vitals:  ?Vitals Value Taken Time  ?BP 90/60 04/14/21 0950  ?Temp 37.4 ?C 04/14/21 0950  ?Pulse 52 04/14/21 0951  ?Resp 14 04/14/21 0951  ?SpO2 100 % 04/14/21 0951  ?Vitals shown include unvalidated device data. ? ?Last Pain:  ?Vitals:  ? 04/14/21 0950  ?TempSrc: Tympanic  ?PainSc: 0-No pain  ?   ? ?Patients Stated Pain Goal: 7 (04/14/21 8366) ? ?Complications: No notable events documented. ?

## 2021-04-15 LAB — SURGICAL PATHOLOGY

## 2021-04-16 ENCOUNTER — Encounter (HOSPITAL_COMMUNITY): Payer: Self-pay | Admitting: Obstetrics & Gynecology

## 2021-04-21 DIAGNOSIS — Z029 Encounter for administrative examinations, unspecified: Secondary | ICD-10-CM

## 2021-04-23 ENCOUNTER — Encounter: Payer: Self-pay | Admitting: Obstetrics & Gynecology

## 2021-04-23 ENCOUNTER — Ambulatory Visit (INDEPENDENT_AMBULATORY_CARE_PROVIDER_SITE_OTHER): Payer: BC Managed Care – PPO | Admitting: Obstetrics & Gynecology

## 2021-04-23 ENCOUNTER — Other Ambulatory Visit: Payer: Self-pay

## 2021-04-23 VITALS — BP 109/75 | HR 102 | Ht 60.0 in | Wt 166.0 lb

## 2021-04-23 DIAGNOSIS — Z9889 Other specified postprocedural states: Secondary | ICD-10-CM

## 2021-04-23 NOTE — Progress Notes (Signed)
?  HPI: ?Patient returns for routine postoperative follow-up having undergone TVH 04/14/21.  ?The patient's immediate postoperative recovery has been unremarkable. ?Since hospital discharge the patient reports minimal spotting minimal pain. ? ? ?Current Outpatient Medications: ?ciprofloxacin (CIPRO) 500 MG tablet, Take 1 tablet (500 mg total) by mouth 2 (two) times daily., Disp: 14 tablet, Rfl: 0 ?ketorolac (TORADOL) 10 MG tablet, Take 1 tablet (10 mg total) by mouth every 8 (eight) hours as needed., Disp: 15 tablet, Rfl: 0 ?ondansetron (ZOFRAN-ODT) 8 MG disintegrating tablet, Take 1 tablet (8 mg total) by mouth every 8 (eight) hours as needed for nausea or vomiting., Disp: 8 tablet, Rfl: 0 ?oxyCODONE-acetaminophen (PERCOCET) 7.5-325 MG tablet, Take 1-2 tablets by mouth every 6 (six) hours as needed., Disp: 30 tablet, Rfl: 0 ?Pramox-PE-Glycerin-Petrolatum (HEMORRHOIDAL EX), Place 1 application. rectally See admin instructions. Apply for 2 days (03/07 & 03/08), Disp: , Rfl:  ? ?No current facility-administered medications for this visit. ? ? ? ?Blood pressure 109/75, pulse (!) 102, height 5' (1.524 m), weight 166 lb (75.3 kg), last menstrual period 12/18/2020. ? ?Physical Exam: ?Abdominal exam is benign ? ?Diagnostic Tests: ? ? ?Pathology: ?benign ? ?Impression: ?1. S/P TVH 04/14/21 ? ?  ? ? ?Plan: ?NO sex ?Nothing in vagina ? ? ? ?Follow up: ?Return in about 5 weeks (around 05/28/2021) for [place], with Dr Elonda Husky. ? ? ? ?Florian Buff, MD ? ?

## 2021-04-26 ENCOUNTER — Ambulatory Visit: Payer: BC Managed Care – PPO | Admitting: Family Medicine

## 2021-04-26 ENCOUNTER — Other Ambulatory Visit: Payer: Self-pay

## 2021-04-27 ENCOUNTER — Other Ambulatory Visit: Payer: Self-pay | Admitting: Obstetrics & Gynecology

## 2021-05-31 ENCOUNTER — Encounter: Payer: Self-pay | Admitting: *Deleted

## 2021-05-31 ENCOUNTER — Encounter: Payer: Self-pay | Admitting: Obstetrics & Gynecology

## 2021-05-31 ENCOUNTER — Ambulatory Visit (INDEPENDENT_AMBULATORY_CARE_PROVIDER_SITE_OTHER): Payer: BC Managed Care – PPO | Admitting: Obstetrics & Gynecology

## 2021-05-31 VITALS — BP 116/74 | HR 92 | Ht 60.0 in | Wt 169.0 lb

## 2021-05-31 DIAGNOSIS — Z9889 Other specified postprocedural states: Secondary | ICD-10-CM

## 2021-05-31 DIAGNOSIS — N760 Acute vaginitis: Secondary | ICD-10-CM

## 2021-05-31 DIAGNOSIS — B9689 Other specified bacterial agents as the cause of diseases classified elsewhere: Secondary | ICD-10-CM

## 2021-05-31 MED ORDER — METRONIDAZOLE 0.75 % VA GEL: VAGINAL | 1 refills | Status: DC

## 2021-05-31 NOTE — Progress Notes (Signed)
?  HPI: ?Patient returns for routine postoperative follow-up having undergone TVH on 04/14/21.  ?The patient's immediate postoperative recovery has been unremarkable. ?Since hospital discharge the patient reports BV symptoms. ? ? ?Current Outpatient Medications: ?ibuprofen (ADVIL) 800 MG tablet, Take 800 mg by mouth every 8 (eight) hours as needed., Disp: , Rfl:  ?metroNIDAZOLE (METROGEL VAGINAL) 0.75 % vaginal gel, Every other night for 5 doses, Disp: 70 g, Rfl: 1 ?ciprofloxacin (CIPRO) 500 MG tablet, Take 1 tablet (500 mg total) by mouth 2 (two) times daily. (Patient not taking: Reported on 05/31/2021), Disp: 14 tablet, Rfl: 0 ?ketorolac (TORADOL) 10 MG tablet, Take 1 tablet (10 mg total) by mouth every 8 (eight) hours as needed. (Patient not taking: Reported on 05/31/2021), Disp: 15 tablet, Rfl: 0 ?megestrol (MEGACE) 40 MG tablet, TAKE 1 TABLET BY MOUTH DAILY (Patient not taking: Reported on 05/31/2021), Disp: 30 tablet, Rfl: 3 ?ondansetron (ZOFRAN-ODT) 8 MG disintegrating tablet, Take 1 tablet (8 mg total) by mouth every 8 (eight) hours as needed for nausea or vomiting. (Patient not taking: Reported on 05/31/2021), Disp: 8 tablet, Rfl: 0 ?oxyCODONE-acetaminophen (PERCOCET) 7.5-325 MG tablet, Take 1-2 tablets by mouth every 6 (six) hours as needed. (Patient not taking: Reported on 05/31/2021), Disp: 30 tablet, Rfl: 0 ?Pramox-PE-Glycerin-Petrolatum (HEMORRHOIDAL EX), Place 1 application. rectally See admin instructions. Apply for 2 days (03/07 & 03/08) (Patient not taking: Reported on 05/31/2021), Disp: , Rfl:  ? ?No current facility-administered medications for this visit. ? ? ? ?Blood pressure 116/74, pulse 92, height 5' (1.524 m), weight 169 lb (76.7 kg), last menstrual period 12/18/2020. ? ?Physical Exam: ?+BV ?Cuff still healing ?No masses at top of cuff ? ?Diagnostic Tests: ? ? ?Pathology: ?benign ? ?Impression + Management plan: ?  ICD-10-CM   ?1. S/P TVH 04/14/21  Z98.890   ?  ?2. BV (bacterial vaginosis)  N76.0   ?  B96.89   ? metrogel qohs x 5 doses  ?  ? ? ? ? ? ?Medications Prescribed this encounter: ?Orders Placed This Encounter ?    metroNIDAZOLE (METROGEL VAGINAL) 0.75 % vaginal gel ?        Sig: Every other night for 5 doses ?        Dispense:  70 g ?        Refill:  1 ? ? ? ? ?Follow up: ?Return in about 3 years (around 05/31/2024). ? ? ? ?Florian Buff, MD ?Attending Physician for the [facility] and ?[facility] ?05/31/2021 ?10:10 AM ? ? ? ? ?

## 2021-07-02 DIAGNOSIS — R0789 Other chest pain: Secondary | ICD-10-CM | POA: Diagnosis not present

## 2021-07-02 DIAGNOSIS — R079 Chest pain, unspecified: Secondary | ICD-10-CM | POA: Diagnosis not present

## 2021-07-02 DIAGNOSIS — R0902 Hypoxemia: Secondary | ICD-10-CM | POA: Diagnosis not present

## 2021-07-02 DIAGNOSIS — R0689 Other abnormalities of breathing: Secondary | ICD-10-CM | POA: Diagnosis not present

## 2021-08-10 DIAGNOSIS — Z20822 Contact with and (suspected) exposure to covid-19: Secondary | ICD-10-CM | POA: Diagnosis not present

## 2021-08-10 DIAGNOSIS — R6889 Other general symptoms and signs: Secondary | ICD-10-CM | POA: Diagnosis not present

## 2021-08-10 DIAGNOSIS — R112 Nausea with vomiting, unspecified: Secondary | ICD-10-CM | POA: Diagnosis not present

## 2021-08-10 DIAGNOSIS — B349 Viral infection, unspecified: Secondary | ICD-10-CM | POA: Diagnosis not present

## 2021-08-12 ENCOUNTER — Ambulatory Visit (INDEPENDENT_AMBULATORY_CARE_PROVIDER_SITE_OTHER): Payer: BC Managed Care – PPO | Admitting: Family Medicine

## 2021-08-12 VITALS — BP 104/71 | HR 75 | Temp 98.7°F | Wt 170.2 lb

## 2021-08-12 DIAGNOSIS — M5441 Lumbago with sciatica, right side: Secondary | ICD-10-CM

## 2021-08-12 DIAGNOSIS — R1030 Lower abdominal pain, unspecified: Secondary | ICD-10-CM | POA: Diagnosis not present

## 2021-08-12 DIAGNOSIS — R350 Frequency of micturition: Secondary | ICD-10-CM

## 2021-08-12 DIAGNOSIS — N760 Acute vaginitis: Secondary | ICD-10-CM

## 2021-08-12 DIAGNOSIS — M545 Low back pain, unspecified: Secondary | ICD-10-CM | POA: Insufficient documentation

## 2021-08-12 LAB — POCT URINALYSIS DIP (CLINITEK)
Spec Grav, UA: 1.02 (ref 1.010–1.025)
pH, UA: 6.5 (ref 5.0–8.0)

## 2021-08-12 MED ORDER — PREDNISONE 10 MG PO TABS
ORAL_TABLET | ORAL | 0 refills | Status: DC
Start: 2021-08-12 — End: 2023-06-21

## 2021-08-12 MED ORDER — FLUCONAZOLE 150 MG PO TABS: 150.0000 mg | ORAL_TABLET | Freq: Once | ORAL | 0 refills | Status: AC

## 2021-08-12 NOTE — Patient Instructions (Signed)
Urine was clear.  Medication as prescribed.  Labs today.  We will call with results.  If you worsen or fail to improve, please let us know.

## 2021-08-12 NOTE — Assessment & Plan Note (Signed)
Treating empirically with Diflucan.

## 2021-08-12 NOTE — Progress Notes (Signed)
Subjective:  Patient ID: Julie Mckay, female    DOB: [birth year]  Age: 36 y.o. MRN: [id number]  CC: Chief Complaint  Patient presents with   right leg pain    Has been going on for 6 days. Hurts from hip down to foot   Headache   Fever   Vaginal Itching    HPI:  36 year old female presents as new patient with multiple complaints.  Patient recently had vaginal hysterectomy on 3/8.  Patient states that she has not been feeling well since Saturday.  She states that she has had a fever although she has not checked her temperature.  She is afebrile here today.  She reports associated headache and nausea and vomiting.  She was seen at a local urgent care and had negative flu and COVID testing.  She states she was treated with Zofran and Tylenol.  Patient reports that she has continued to have some ongoing dizziness and headaches.  She reports that Monday and Tuesday of this week she developed pain in her right leg.  Starts from the low back and radiates down the leg.  Patient also reports that she has vaginal irritation with associated itching.  Additionally, patient reports urinary frequency and she has had some discomfort when she urinates as of yesterday.  Patient Active Problem List   Diagnosis Date Noted   Lower abdominal pain 08/12/2021   Low back pain 08/12/2021   Vaginitis 08/12/2021   History of gestational diabetes 04/09/2019    Social Hx   Social History   Socioeconomic History   Marital status: Soil scientist    Spouse name: Not on file   Number of children: Not on file   Years of education: Not on file   Highest education level: Not on file  Occupational History   Not on file  Tobacco Use   Smoking status: Never   Smokeless tobacco: Never  Vaping Use   Vaping Use: Some days  Substance and Sexual Activity   Alcohol use: Yes    Comment: sometimes   Drug use: Never   Sexual activity: Not Currently    Birth control/protection: Surgical    Comment:  Tubal  Other Topics Concern   Not on file  Social History Narrative   Not on file   Social Determinants of Health   Financial Resource Strain: Not on file  Food Insecurity: Not on file  Transportation Needs: Not on file  Physical Activity: Inactive (05/06/2020)   Exercise Vital Sign    Days of Exercise per Week: 0 days    Minutes of Exercise per Session: 0 min  Stress: Not on file  Social Connections: Moderately Isolated (05/06/2020)   Social Connection and Isolation Panel [NHANES]    Frequency of Communication with Friends and Family: More than three times a week    Frequency of Social Gatherings with Friends and Family: Once a week    Attends Religious Services: Never    Marine scientist or Organizations: No    Attends Archivist Meetings: Never    Marital Status: Living with partner    Review of Systems Per HPI  Objective:  BP 104/71   Pulse 75   Temp 98.7 F (37.1 C) (Oral)   Wt 170 lb 3.2 oz (77.2 kg)   LMP 12/18/2020 (Exact Date)   SpO2 100%   BMI 33.24 kg/m      08/12/2021   10:08 AM 05/31/2021    9:53 AM 04/23/2021  8:55 AM  BP/Weight  Systolic BP 072 257 505  Diastolic BP 71 74 75  Wt. (Lbs) 170.2 169 166  BMI 33.24 kg/m2 33.01 kg/m2 32.42 kg/m2    Physical Exam Vitals and nursing note reviewed.  Constitutional:      General: She is not in acute distress.    Appearance: Normal appearance.  HENT:     Head: Normocephalic and atraumatic.     Nose: Nose normal.     Mouth/Throat:     Pharynx: Oropharynx is clear.  Eyes:     General:        Right eye: No discharge.        Left eye: No discharge.     Conjunctiva/sclera: Conjunctivae normal.  Cardiovascular:     Rate and Rhythm: Normal rate and regular rhythm.  Pulmonary:     Effort: Pulmonary effort is normal.     Breath sounds: Normal breath sounds. No wheezing, rhonchi or rales.  Abdominal:     General: There is no distension.     Palpations: Abdomen is soft.     Comments:  Mild tenderness in the suprapubic region.  Musculoskeletal:     Cervical back: Neck supple.  Neurological:     Mental Status: She is alert.  Psychiatric:        Mood and Affect: Mood normal.        Behavior: Behavior normal.     Lab Results  Component Value Date   WBC 6.6 04/12/2021   HGB 14.3 04/12/2021   HCT 43.8 04/12/2021   PLT 296 04/12/2021   GLUCOSE 86 04/12/2021   ALT 19 04/12/2021   AST 16 04/12/2021   NA 137 04/12/2021   K 3.4 (L) 04/12/2021   CL 107 04/12/2021   CREATININE 0.84 04/12/2021   BUN 15 04/12/2021   CO2 23 04/12/2021     Assessment & Plan:   Problem List Items Addressed This Visit       Genitourinary   Vaginitis    Treating empirically with Diflucan.        Other   Lower abdominal pain - Primary    Sending for labs.  Urinalysis was normal.      Relevant Orders   CBC   CMP14+EGFR   Lipase   Low back pain    Patient having low back pain and pain in the lower extremities particular the right lower extremity.  Placing on prednisone.      Relevant Medications   predniSONE (DELTASONE) 10 MG tablet   Other Visit Diagnoses     Urine frequency       Relevant Orders   POCT URINALYSIS DIP (CLINITEK) (Completed)       Meds ordered this encounter  Medications   predniSONE (DELTASONE) 10 MG tablet    Sig: 50 mg daily x 2 days, then 40 mg daily x 2 days, then 30 mg daily x 2 days, then 20 mg daily x 2 days, then 10 mg daily x 2 days.    Dispense:  30 tablet    Refill:  0   fluconazole (DIFLUCAN) 150 MG tablet    Sig: Take 1 tablet (150 mg total) by mouth once for 1 dose. Repeat dose in 72 hours.    Dispense:  2 tablet    Refill:  Elizabeth

## 2021-08-12 NOTE — Assessment & Plan Note (Signed)
Sending for labs.  Urinalysis was normal.

## 2021-08-12 NOTE — Assessment & Plan Note (Signed)
Patient having low back pain and pain in the lower extremities particular the right lower extremity.  Placing on prednisone.

## 2021-08-13 ENCOUNTER — Other Ambulatory Visit: Payer: Self-pay | Admitting: *Deleted

## 2021-08-13 DIAGNOSIS — R748 Abnormal levels of other serum enzymes: Secondary | ICD-10-CM

## 2021-08-13 LAB — CMP14+EGFR
ALT: 25 IU/L (ref 0–32)
AST: 21 IU/L (ref 0–40)
Albumin/Globulin Ratio: 1.6 (ref 1.2–2.2)
Albumin: 4.2 g/dL (ref 3.8–4.8)
Alkaline Phosphatase: 164 IU/L — ABNORMAL HIGH (ref 44–121)
BUN/Creatinine Ratio: 11 (ref 9–23)
BUN: 10 mg/dL (ref 6–20)
Bilirubin Total: 0.3 mg/dL (ref 0.0–1.2)
CO2: 26 mmol/L (ref 20–29)
Calcium: 9 mg/dL (ref 8.7–10.2)
Chloride: 102 mmol/L (ref 96–106)
Creatinine, Ser: 0.9 mg/dL (ref 0.57–1.00)
Globulin, Total: 2.6 g/dL (ref 1.5–4.5)
Glucose: 70 mg/dL (ref 70–99)
Potassium: 4.4 mmol/L (ref 3.5–5.2)
Sodium: 140 mmol/L (ref 134–144)
Total Protein: 6.8 g/dL (ref 6.0–8.5)
eGFR: 85 mL/min/{1.73_m2} (ref >59)

## 2021-08-13 LAB — CBC
Hematocrit: 40.3 % (ref 34.0–46.6)
Hemoglobin: 13.4 g/dL (ref 11.1–15.9)
MCH: 30.7 pg (ref 26.6–33.0)
MCHC: 33.3 g/dL (ref 31.5–35.7)
MCV: 92 fL (ref 79–97)
Platelets: 253 10*3/uL (ref 150–450)
RBC: 4.36 x10E6/uL (ref 3.77–5.28)
RDW: 12 % (ref 11.7–15.4)
WBC: 6.8 10*3/uL (ref 3.4–10.8)

## 2021-08-13 LAB — LIPASE: Lipase: 22 U/L (ref 14–72)

## 2023-03-09 ENCOUNTER — Encounter: Payer: Self-pay | Admitting: Family Medicine

## 2023-03-09 ENCOUNTER — Ambulatory Visit: Payer: BC Managed Care – PPO | Admitting: Family Medicine

## 2023-04-03 ENCOUNTER — Ambulatory Visit: Payer: BC Managed Care – PPO | Admitting: Family Medicine

## 2023-06-19 ENCOUNTER — Ambulatory Visit (INDEPENDENT_AMBULATORY_CARE_PROVIDER_SITE_OTHER): Admitting: Family Medicine

## 2023-06-19 ENCOUNTER — Encounter: Payer: Self-pay | Admitting: Family Medicine

## 2023-06-19 DIAGNOSIS — F321 Major depressive disorder, single episode, moderate: Secondary | ICD-10-CM | POA: Insufficient documentation

## 2023-06-19 DIAGNOSIS — R5383 Other fatigue: Secondary | ICD-10-CM | POA: Diagnosis not present

## 2023-06-19 NOTE — Assessment & Plan Note (Signed)
 Discussed that depression is likely the source of all of her symptoms.  Patient wants to wait until laboratory studies are back before treatment is started.

## 2023-06-19 NOTE — Progress Notes (Signed)
 Subjective:  Patient ID: Julie Mckay, female    DOB: [birth year]  Age: 38 y.o. MRN: [id number]  CC:   Chief Complaint  Patient presents with   Depression    Depression, not carrying about anything, hot flashes, not sleeping and no appetite. Ongoing but lately has been stronger. Did some testing in [place].     HPI:  38 year old female presents for evaluation of the above.  Spanish interpreter present today.  Patient states that over the past 8 months to 1 year she has been experiencing depression, decreased libido, fatigue, hot flashes, difficulty sleeping, and decreased appetite.  Patient states that she has very little interest in doing things.  PHQ-9 score of 9.  GAD-7 score of 7.  Denies any stressors at home or at work.  She states that she had some labs done in [place].  She has these with her today.  Labs for [facility], LH, estradiol.  Labs are all within normal limits.  Patient concerned about underlying cause for her symptoms.  Will discuss today.  Social Hx   Social History   Socioeconomic History   Marital status: Media planner    Spouse name: Not on file   Number of children: Not on file   Years of education: Not on file   Highest education level: 12th grade  Occupational History   Not on file  Tobacco Use   Smoking status: Never   Smokeless tobacco: Never  Vaping Use   Vaping status: Some Days  Substance and Sexual Activity   Alcohol use: Yes    Comment: sometimes   Drug use: Never   Sexual activity: Not Currently    Birth control/protection: Surgical    Comment: Tubal  Other Topics Concern   Not on file  Social History Narrative   Not on file   Social Drivers of Health   Financial Resource Strain: Not on file  Food Insecurity: No Food Insecurity (05/06/2020)   Hunger Vital Sign    Worried About Running Out of Food in the Last Year: Never true    Ran Out of Food in the Last Year: Never true  Transportation Needs: No Transportation Needs  (05/06/2020)   PRAPARE - Administrator, Civil Service (Medical): No    Lack of Transportation (Non-Medical): No  Physical Activity: Inactive (05/06/2020)   Exercise Vital Sign    Days of Exercise per Week: 0 days    Minutes of Exercise per Session: 0 min  Stress: No Stress Concern Present (05/06/2020)   [employer] of Occupational Health - Occupational Stress Questionnaire    Feeling of Stress : Not at all  Social Connections: Moderately Isolated (05/06/2020)   Social Connection and Isolation Panel [NHANES]    Frequency of Communication with Friends and Family: More than three times a week    Frequency of Social Gatherings with Friends and Family: Once a week    Attends Religious Services: Never    Database administrator or Organizations: No    Attends Engineer, structural: Never    Marital Status: Living with partner    Review of Systems Per HPI  Objective:  BP 102/71   Pulse 64   Temp 98.2 F (36.8 C)   Ht 5' (1.524 m)   Wt 168 lb (76.2 kg)   LMP 12/18/2020 (Exact Date)   SpO2 99%   BMI 32.81 kg/m      06/19/2023    3:36 PM 08/12/2021   10:08 AM  05/31/2021    9:53 AM  BP/Weight  Systolic BP 102 104 116  Diastolic BP 71 71 74  Wt. (Lbs) 168 170.2 169  BMI 32.81 kg/m2 33.24 kg/m2 33.01 kg/m2    Physical Exam Vitals and nursing note reviewed.  Constitutional:      General: She is not in acute distress.    Appearance: Normal appearance.  HENT:     Head: Normocephalic and atraumatic.  Cardiovascular:     Rate and Rhythm: Normal rate and regular rhythm.  Pulmonary:     Effort: Pulmonary effort is normal.     Breath sounds: Normal breath sounds. No wheezing or rhonchi.  Neurological:     Mental Status: She is alert.  Psychiatric:        Mood and Affect: Mood normal.        Behavior: Behavior normal.     Lab Results  Component Value Date   WBC 6.8 08/12/2021   HGB 13.4 08/12/2021   HCT 40.3 08/12/2021   PLT 253 08/12/2021    GLUCOSE 70 08/12/2021   ALT 25 08/12/2021   AST 21 08/12/2021   NA 140 08/12/2021   K 4.4 08/12/2021   CL 102 08/12/2021   CREATININE 0.90 08/12/2021   BUN 10 08/12/2021   CO2 26 08/12/2021     Assessment & Plan:  Fatigue, unspecified type Assessment & Plan: Likely secondary to depression.  However, will obtain labs for further evaluation.  Orders: -     TSH + free T4 -     CBC -     CMP14+EGFR  Depression, major, single episode, moderate (HCC) Assessment & Plan: Discussed that depression is likely the source of all of her symptoms.  Patient wants to wait until laboratory studies are back before treatment is started.     Follow-up:  Pending lab results.  Kathleen Papa DO [facility]

## 2023-06-19 NOTE — Patient Instructions (Signed)
 Anlisis hoy.  [employer].

## 2023-06-19 NOTE — Assessment & Plan Note (Signed)
 Likely secondary to depression.  However, will obtain labs for further evaluation.

## 2023-06-20 LAB — CMP14+EGFR
ALT: 19 IU/L (ref 0–32)
AST: 15 IU/L (ref 0–40)
Albumin: 4.1 g/dL (ref 3.9–4.9)
Alkaline Phosphatase: 160 IU/L — ABNORMAL HIGH (ref 44–121)
BUN/Creatinine Ratio: 14 (ref 9–23)
BUN: 14 mg/dL (ref 6–20)
Bilirubin Total: <0.2 mg/dL (ref 0.0–1.2)
CO2: 26 mmol/L (ref 20–29)
Calcium: 8.6 mg/dL — ABNORMAL LOW (ref 8.7–10.2)
Chloride: 104 mmol/L (ref 96–106)
Creatinine, Ser: 0.99 mg/dL (ref 0.57–1.00)
Globulin, Total: 2.5 g/dL (ref 1.5–4.5)
Glucose: 138 mg/dL — ABNORMAL HIGH (ref 70–99)
Potassium: 3.9 mmol/L (ref 3.5–5.2)
Sodium: 140 mmol/L (ref 134–144)
Total Protein: 6.6 g/dL (ref 6.0–8.5)
eGFR: 75 mL/min/{1.73_m2} (ref >59)

## 2023-06-20 LAB — TSH+FREE T4
Free T4: 1.35 ng/dL (ref 0.82–1.77)
TSH: 1.42 u[IU]/mL (ref 0.450–4.500)

## 2023-06-20 LAB — CBC
Hematocrit: 41.8 % (ref 34.0–46.6)
Hemoglobin: 12.9 g/dL (ref 11.1–15.9)
MCH: 29.8 pg (ref 26.6–33.0)
MCHC: 30.9 g/dL — ABNORMAL LOW (ref 31.5–35.7)
MCV: 97 fL (ref 79–97)
Platelets: 280 10*3/uL (ref 150–450)
RBC: 4.33 x10E6/uL (ref 3.77–5.28)
RDW: 12.7 % (ref 11.7–15.4)
WBC: 6.4 10*3/uL (ref 3.4–10.8)

## 2023-06-21 ENCOUNTER — Other Ambulatory Visit: Payer: Self-pay | Admitting: Family Medicine

## 2023-06-21 ENCOUNTER — Ambulatory Visit: Payer: Self-pay | Admitting: Family Medicine

## 2023-06-21 DIAGNOSIS — R748 Abnormal levels of other serum enzymes: Secondary | ICD-10-CM

## 2023-06-21 DIAGNOSIS — R7309 Other abnormal glucose: Secondary | ICD-10-CM

## 2023-06-22 LAB — HEMOGLOBIN A1C
Est. average glucose Bld gHb Est-mCnc: 117 mg/dL
Hgb A1c MFr Bld: 5.7 % — ABNORMAL HIGH (ref 4.8–5.6)

## 2023-06-24 LAB — ALKALINE PHOSPHATASE, ISOENZYMES
Alkaline Phosphatase: 161 IU/L — ABNORMAL HIGH (ref 44–121)
BONE FRACTION: 24 % (ref 14–68)
INTESTINAL FRAC.: 12 % (ref 0–18)
LIVER FRACTION: 64 % (ref 18–85)

## 2023-06-24 LAB — GAMMA GT: GGT: 18 IU/L (ref 0–60)

## 2023-06-25 ENCOUNTER — Ambulatory Visit: Payer: Self-pay | Admitting: Family Medicine

## 2023-06-27 ENCOUNTER — Other Ambulatory Visit: Payer: Self-pay

## 2023-06-27 DIAGNOSIS — R748 Abnormal levels of other serum enzymes: Secondary | ICD-10-CM

## 2023-07-10 ENCOUNTER — Ambulatory Visit (HOSPITAL_COMMUNITY)
Admission: RE | Admit: 2023-07-10 | Discharge: 2023-07-10 | Disposition: A | Discharge status: Home / Self Care | Source: Ambulatory Visit | Attending: Family Medicine | Admitting: Family Medicine

## 2023-07-10 ENCOUNTER — Ambulatory Visit: Payer: Self-pay | Admitting: Family Medicine

## 2023-07-10 DIAGNOSIS — R748 Abnormal levels of other serum enzymes: Secondary | ICD-10-CM | POA: Diagnosis not present

## 2023-07-11 ENCOUNTER — Other Ambulatory Visit: Payer: Self-pay

## 2023-07-11 DIAGNOSIS — R1030 Lower abdominal pain, unspecified: Secondary | ICD-10-CM

## 2023-07-11 DIAGNOSIS — R748 Abnormal levels of other serum enzymes: Secondary | ICD-10-CM

## 2023-07-24 ENCOUNTER — Emergency Department (HOSPITAL_COMMUNITY)
Admission: EM | Admit: 2023-07-24 | Discharge: 2023-07-24 | Disposition: A | Discharge status: Home / Self Care | Attending: Emergency Medicine | Admitting: Emergency Medicine

## 2023-07-24 ENCOUNTER — Encounter (HOSPITAL_COMMUNITY): Payer: Self-pay

## 2023-07-24 ENCOUNTER — Other Ambulatory Visit: Payer: Self-pay

## 2023-07-24 DIAGNOSIS — M545 Low back pain, unspecified: Secondary | ICD-10-CM | POA: Diagnosis not present

## 2023-07-24 DIAGNOSIS — M5459 Other low back pain: Secondary | ICD-10-CM | POA: Diagnosis not present

## 2023-07-24 MED ORDER — NAPROXEN 500 MG PO TABS: 500.0000 mg | ORAL_TABLET | Freq: Two times a day (BID) | ORAL | 0 refills | Status: AC

## 2023-07-24 MED ORDER — HYDROMORPHONE HCL 1 MG/ML IJ SOLN
1.0000 mg | Freq: Once | INTRAMUSCULAR | Status: AC
  Administered 2023-07-24: 1 mg via INTRAMUSCULAR
  Filled 2023-07-24: qty 1

## 2023-07-24 MED ORDER — LIDOCAINE 5 % EX PTCH: 1.0000 | MEDICATED_PATCH | CUTANEOUS | 0 refills | Status: AC

## 2023-07-24 MED ORDER — NAPROXEN 250 MG PO TABS
500.0000 mg | ORAL_TABLET | Freq: Once | ORAL | Status: AC
  Administered 2023-07-24: 500 mg via ORAL
  Filled 2023-07-24: qty 2

## 2023-07-24 MED ORDER — METHOCARBAMOL 500 MG PO TABS
500.0000 mg | ORAL_TABLET | Freq: Three times a day (TID) | ORAL | 0 refills | Status: AC | PRN
Start: 2023-07-24 — End: ?

## 2023-07-24 NOTE — ED Provider Notes (Signed)
 [facility] Emergency Department Provider Note MRN:  [id number]  Arrival date & time: 07/24/23     Chief Complaint   Back Pain   History of Present Illness   Julie Mckay is a 38 y.o. year-old female with no pertinent past medical history presenting to the ED with chief complaint of neck pain.  Lifting something heavy yesterday and felt immediate pain to the lower back.  Pain is nonradiating, constant since then.  Denies numbness or weakness to the arms or legs, no bowel or bladder dysfunction, no fever, no trauma.  Review of Systems  A thorough review of systems was obtained and all systems are negative except as noted in the HPI and PMH.   Patient's Health History    Past Medical History:  Diagnosis Date   Bilateral ovarian cysts     Past Surgical History:  Procedure Laterality Date   LAPAROSCOPIC BILATERAL SALPINGECTOMY Bilateral 07/08/2020   Procedure: LAPAROSCOPIC BILATERAL SALPINGECTOMY Procedure #1;  Surgeon: Wendelyn Halter, MD;  Location: [facility];  Service: Gynecology;  Laterality: Bilateral;   NORPLANT  REMOVAL Left 07/08/2020   Procedure: REMOVAL OF NEXPLANON Procedure #2;  Surgeon: Wendelyn Halter, MD;  Location: [facility];  Service: Gynecology;  Laterality: Left;   VAGINAL HYSTERECTOMY N/A 04/14/2021   Procedure: HYSTERECTOMY VAGINAL;  Surgeon: Wendelyn Halter, MD;  Location: [facility];  Service: Gynecology;  Laterality: N/A;    Family History  Problem Relation Age of Onset   Diabetes Maternal Grandmother    Hypertension Father     Social History   Socioeconomic History   Marital status: Media planner    Spouse name: Not on file   Number of children: Not on file   Years of education: Not on file   Highest education level: 12th grade  Occupational History   Not on file  Tobacco Use   Smoking status: Never   Smokeless tobacco: Never  Vaping Use   Vaping status: Some Days  Substance and Sexual Activity   Alcohol use: Yes     Comment: sometimes   Drug use: Never   Sexual activity: Not Currently    Birth control/protection: Surgical    Comment: Tubal  Other Topics Concern   Not on file  Social History Narrative   Not on file   Social Drivers of Health   Financial Resource Strain: Not on file  Food Insecurity: No Food Insecurity (05/06/2020)   Hunger Vital Sign    Worried About Running Out of Food in the Last Year: Never true    Ran Out of Food in the Last Year: Never true  Transportation Needs: No Transportation Needs (05/06/2020)   PRAPARE - Administrator, Civil Service (Medical): No    Lack of Transportation (Non-Medical): No  Physical Activity: Inactive (05/06/2020)   Exercise Vital Sign    Days of Exercise per Week: 0 days    Minutes of Exercise per Session: 0 min  Stress: No Stress Concern Present (05/06/2020)   [employer] of Occupational Health - Occupational Stress Questionnaire    Feeling of Stress : Not at all  Social Connections: Moderately Isolated (05/06/2020)   Social Connection and Isolation Panel    Frequency of Communication with Friends and Family: More than three times a week    Frequency of Social Gatherings with Friends and Family: Once a week    Attends Religious Services: Never    Database administrator or Organizations: No    Attends  Club or Organization Meetings: Never    Marital Status: Living with partner  Intimate Partner Violence: Not At Risk (05/06/2020)   Humiliation, Afraid, Rape, and Kick questionnaire    Fear of Current or Ex-Partner: No    Emotionally Abused: No    Physically Abused: No    Sexually Abused: No     Physical Exam   Vitals:   07/24/23 0604 07/24/23 0652  BP: (!) 115/91 106/78  Pulse: 79 81  Resp: 17 15  Temp: 97.9 F (36.6 C)   SpO2: 96% 98%    CONSTITUTIONAL: Well-appearing, NAD NEURO/PSYCH:  Alert and oriented x 3, no focal deficits EYES:  eyes equal and reactive ENT/NECK:  no LAD, no JVD CARDIO: Regular rate,  well-perfused, normal S1 and S2 PULM:  CTAB no wheezing or rhonchi GI/GU:  non-distended, non-tender MSK/SPINE:  No gross deformities, no edema SKIN:  no rash, atraumatic   *Additional and/or pertinent findings included in MDM below  Diagnostic and Interventional Summary    EKG Interpretation Date/Time:    Ventricular Rate:    PR Interval:    QRS Duration:    QT Interval:    QTC Calculation:   R Axis:      Text Interpretation:         Labs Reviewed - No data to display  No orders to display    Medications  HYDROmorphone  (DILAUDID ) injection 1 mg (1 mg Intramuscular Given 07/24/23 4098)  naproxen  (NAPROSYN ) tablet 500 mg (500 mg Oral Given 07/24/23 0631)     Procedures  /  Critical Care Procedures  ED Course and Medical Decision Making  Initial Impression and Ddx Patient has normal strength and sensation to the legs, nonradiating lower back pain, favoring muscle strain or spasm versus herniated disc, nothing to suggest myelopathy.  No real trauma to warrant imaging.  Providing pain control and will reassess.  Past medical/surgical history that increases complexity of ED encounter: None  Interpretation of Diagnostics Laboratory and/or imaging options to aid in the diagnosis/care of the patient were considered.  After careful history and physical examination, it was determined that there was no indication for diagnostics at this time.  Patient Reassessment and Ultimate Disposition/Management     Patient feeling much better upon reassessment, appropriate for discharge.  Patient management required discussion with the following services or consulting groups:  None  Complexity of Problems Addressed Acute illness or injury that poses threat of life of bodily function  Additional Data Reviewed and Analyzed Further history obtained from: Further history from spouse/family member  Additional Factors Impacting ED Encounter Risk Prescriptions and Use of parenteral  controlled substances  Layne Dilauro M. Harless Lien, MD [facility] Emergency Medicine [facility] [email or web address]  Final Clinical Impressions(s) / ED Diagnoses     ICD-10-CM   1. Acute midline low back pain without sciatica  M54.50       ED Discharge Orders          Ordered    lidocaine  (LIDODERM ) 5 %  Every 24 hours        07/24/23 0713    naproxen  (NAPROSYN ) 500 MG tablet  2 times daily        07/24/23 0713    methocarbamol (ROBAXIN) 500 MG tablet  Every 8 hours PRN        07/24/23 1191             Discharge Instructions Discussed with and Provided to Patient:     Discharge Instructions  You were evaluated in the Emergency Department and after careful evaluation, we did not find any emergent condition requiring admission or further testing in the hospital.  Your exam/testing today is overall reassuring.  Symptoms likely due to a muscle strain or spasm or possibly a herniated disc.  Recommend using the Naprosyn  twice daily as prescribed for pain.  Can use the Robaxin muscle relaxer for more significant pain, best used at night if you are having trouble sleeping as it can cause drowsiness.  Can also use the numbing patches provided daily.  Please return to the Emergency Department if you experience any worsening of your condition.   Thank you for allowing us  to be a part of your care.       Edson Graces, MD 07/24/23 [phone]

## 2023-07-24 NOTE — ED Triage Notes (Signed)
 Patient from home for lower L back pain that started yesterday after she lifted a heavy bag of wet sand. Patient states the pain is constant and 7/10. Pain does not travel anywhere. Reports she is unable to sleep due to the pain; cannot get comfortable whether standing, sitting, or lying. Upon arrival to ER, patient is alert and oriented, ambu with slowed gait

## 2023-07-24 NOTE — Discharge Instructions (Addendum)
 You were evaluated in the Emergency Department and after careful evaluation, we did not find any emergent condition requiring admission or further testing in the hospital.  Your exam/testing today is overall reassuring.  Symptoms likely due to a muscle strain or spasm or possibly a herniated disc.  Recommend using the Naprosyn  twice daily as prescribed for pain.  Can use the Robaxin muscle relaxer for more significant pain, best used at night if you are having trouble sleeping as it can cause drowsiness.  Can also use the numbing patches provided daily.  Please return to the Emergency Department if you experience any worsening of your condition.   Thank you for allowing us  to be a part of your care.

## 2024-01-15 ENCOUNTER — Ambulatory Visit: Admitting: Family Medicine
